# Patient Record
Sex: Female | Born: 1990 | Race: Black or African American | Hispanic: No | Marital: Married | Smoking: Never smoker
Health system: Southern US, Community
[De-identification: ages and names within clinical notes are randomized; demographics above are authoritative.]

## PROBLEM LIST (undated history)

## (undated) DIAGNOSIS — Z789 Other specified health status: Secondary | ICD-10-CM

## (undated) DIAGNOSIS — D649 Anemia, unspecified: Secondary | ICD-10-CM

## (undated) HISTORY — PX: WISDOM TOOTH EXTRACTION: SHX21

## (undated) HISTORY — DX: Other specified health status: Z78.9

---

## 2019-11-22 NOTE — L&D Delivery Note (Signed)
Delivery Summary for Wanda Richmond  Labor Events:   Preterm labor: No data found  Rupture date: 09/18/2020  Rupture time: 7:36 AM  Rupture type: Spontaneous  Fluid Color: Clear  Induction: No data found  Augmentation: No data found  Complications: No data found  Cervical ripening: No data found No data found   No data found     Delivery:   Episiotomy: No data found  Lacerations: No data found  Repair suture: No data found  Repair # of packets: No data found  Blood loss (ml): 100   Information for the patient's newborn:  See, Beharry [[id number]]    Delivery 09/18/2020 10:20 AM by  Vaginal, Spontaneous Sex:  female Gestational Age: [redacted]w[redacted]d Delivery Clinician:   Living?:         APGARS  One minute Five minutes Ten minutes  Skin color:        Heart rate:        Grimace:        Muscle tone:        Breathing:        Totals: 7  9      Presentation/position:      Resuscitation:   Cord information:    Disposition of cord blood:     Blood gases sent?  Complications:   Placenta: Delivered:       appearance Newborn Measurements: Weight: 7 lb 1.6 oz (3220 g)  Height: 20.67"  Head circumference:    Chest circumference:    Other providers:    Additional  information: Forceps:   Vacuum:   Breech:   Observed anomalies         Delivery Note At 10:20 AM a viable and healthy female was delivered via Vaginal, Spontaneous (Presentation:   Occiput Posterior).  APGAR: 7, 9; weight 7 lb 1.6 oz (3220 g).  Terminal thick meconium fluid present.  Placenta status: Spontaneous, Intact.  Cord: 3 vessels with the following complications: Nuchal cord x 1, body cord x 1, and true knot x1.  Cord pH: not obtained. Delayed cord clamping performed, however observed for less than 2 minutes due to fetal status.   Anesthesia: Epidural Episiotomy: None Lacerations: None Suture Repair: None Est. Blood Loss (mL): 100  Mom to postpartum.  Baby to Couplet care / Skin to Skin.  Hildred Laser,  MD 09/18/2020, 1:09 PM

## 2020-02-03 ENCOUNTER — Other Ambulatory Visit: Payer: Self-pay

## 2020-02-03 ENCOUNTER — Encounter (INDEPENDENT_AMBULATORY_CARE_PROVIDER_SITE_OTHER): Payer: Self-pay

## 2020-02-03 ENCOUNTER — Ambulatory Visit: Payer: Self-pay

## 2020-02-03 VITALS — BP 100/62 | HR 91 | Ht 66.5 in | Wt 163.8 lb

## 2020-02-03 DIAGNOSIS — Z0283 Encounter for blood-alcohol and blood-drug test: Secondary | ICD-10-CM

## 2020-02-03 DIAGNOSIS — Z113 Encounter for screening for infections with a predominantly sexual mode of transmission: Secondary | ICD-10-CM

## 2020-02-03 DIAGNOSIS — Z3481 Encounter for supervision of other normal pregnancy, first trimester: Secondary | ICD-10-CM

## 2020-02-03 NOTE — Patient Instructions (Signed)
Heartburn During Pregnancy  Heartburn is pain or discomfort in the throat or chest. It may cause a burning feeling. It happens when stomach acid moves up into the tube that carries food from your mouth to your stomach (esophagus). Heartburn is common during pregnancy. It usually goes away or gets better after giving birth. Follow these instructions at home: Eating and drinking  Do not drink alcohol while you are pregnant.  Figure out which foods and beverages make you feel worse, and avoid them.  Beverages that you may want to avoid include: ? Coffee and tea (with or without caffeine). ? Energy drinks and sports drinks. ? Bubbly (carbonated) drinks or sodas. ? Citrus fruit juices.  Foods that you may want to avoid include: ? Chocolate and cocoa. ? Peppermint and mint flavorings. ? Garlic, onions, and horseradish. ? Spicy and acidic foods. These include peppers, chili powder, curry powder, vinegar, hot sauces, and barbecue sauce. ? Citrus fruits, such as oranges, lemons, and limes. ? Tomato-based foods, such as red sauce, chili, and salsa. ? Fried and fatty foods, such as donuts, french fries, potato chips, and high-fat dressings. ? High-fat meats, such as hot dogs, cold cuts, sausage, ham, and bacon. ? High-fat dairy items, such as whole milk, butter, and cheese.  Eat small meals often, instead of large meals.  Avoid drinking a lot of liquid with your meals.  Avoid eating meals during the 2-3 hours before you go to bed.  Avoid lying down right after you eat.  Do not exercise right after you eat. Medicines  Take over-the-counter and prescription medicines only as told by your doctor.  Do not take aspirin, ibuprofen, or other NSAIDs unless your doctor tells you to do that.  Your doctor may tell you to avoid medicines that have sodium bicarbonate in them. General instructions   If told, raise the head of your bed about 6 inches (15 cm). You can do this by putting blocks  under the legs. Sleeping with more pillows does not help with heartburn.  Do not use any products that contain nicotine or tobacco, such as cigarettes and e-cigarettes. If you need help quitting, ask your doctor.  Wear loose-fitting clothing.  Try to lower your stress, such as with yoga or meditation. If you need help, ask your doctor.  Stay at a healthy weight. If you are overweight, work with your doctor to safely lose weight.  Keep all follow-up visits as told by your doctor. This is important. Contact a doctor if:  You get new symptoms.  Your symptoms do not get better with treatment.  You have weight loss and you do not know why.  You have trouble swallowing.  You make loud sounds when you breathe (wheeze).  You have a cough that does not go away.  You have heartburn often for more than 2 weeks.  You feel sick to your stomach (nauseous), and this does not get better with treatment.  You are throwing up (vomiting), and this does not get better with treatment.  You have pain in your belly (abdomen). Get help right away if:  You have very bad chest pain that spreads to your arm, neck, or jaw.  You feel sweaty, dizzy, or light-headed.  You have trouble breathing.  You have pain when swallowing.  You throw up and your throw-up looks like blood or coffee grounds.  Your poop (stool) is bloody or black. This information is not intended to replace advice given to you by your health  care provider. Make sure you discuss any questions you have with your health care provider. Document Revised: 02/28/2019 Document Reviewed: 07/25/2016 Elsevier Patient Education  [place] of Pregnancy  The second trimester is from week 14 through week 27 (month 4 through 6). This is often the time in pregnancy that you feel your best. Often times, morning sickness has lessened or quit. You may have more energy, and you may get hungry more often. Your unborn baby is  growing rapidly. At the end of the sixth month, he or she is about 9 inches long and weighs about 1 pounds. You will likely feel the baby move between 18 and 20 weeks of pregnancy. Follow these instructions at home: Medicines  Take over-the-counter and prescription medicines only as told by your doctor. Some medicines are safe and some medicines are not safe during pregnancy.  Take a prenatal vitamin that contains at least 600 micrograms (mcg) of folic acid.  If you have trouble pooping (constipation), take medicine that will make your stool soft (stool softener) if your doctor approves. Eating and drinking   Eat regular, healthy meals.  Avoid raw meat and uncooked cheese.  If you get low calcium from the food you eat, talk to your doctor about taking a daily calcium supplement.  Avoid foods that are high in fat and sugars, such as fried and sweet foods.  If you feel sick to your stomach (nauseous) or throw up (vomit): ? Eat 4 or 5 small meals a day instead of 3 large meals. ? Try eating a few soda crackers. ? Drink liquids between meals instead of during meals.  To prevent constipation: ? Eat foods that are high in fiber, like fresh fruits and vegetables, whole grains, and beans. ? Drink enough fluids to keep your pee (urine) clear or pale yellow. Activity  Exercise only as told by your doctor. Stop exercising if you start to have cramps.  Do not exercise if it is too hot, too humid, or if you are in a place of great height (high altitude).  Avoid heavy lifting.  Wear low-heeled shoes. Sit and stand up straight.  You can continue to have sex unless your doctor tells you not to. Relieving pain and discomfort  Wear a good support bra if your breasts are tender.  Take warm water baths (sitz baths) to soothe pain or discomfort caused by hemorrhoids. Use hemorrhoid cream if your doctor approves.  Rest with your legs raised if you have leg cramps or low back pain.  If you  develop puffy, bulging veins (varicose veins) in your legs: ? Wear support hose or compression stockings as told by your doctor. ? Raise (elevate) your feet for 15 minutes, 3-4 times a day. ? Limit salt in your food. Prenatal care  Write down your questions. Take them to your prenatal visits.  Keep all your prenatal visits as told by your doctor. This is important. Safety  Wear your seat belt when driving.  Make a list of emergency phone numbers, including numbers for family, friends, the hospital, and police and fire departments. General instructions  Ask your doctor about the right foods to eat or for help finding a counselor, if you need these services.  Ask your doctor about local prenatal classes. Begin classes before month 6 of your pregnancy.  Do not use hot tubs, steam rooms, or saunas.  Do not douche or use tampons or scented sanitary pads.  Do not cross your legs for  long periods of time.  Visit your dentist if you have not done so. Use a soft toothbrush to brush your teeth. Floss gently.  Avoid all smoking, herbs, and alcohol. Avoid drugs that are not approved by your doctor.  Do not use any products that contain nicotine or tobacco, such as cigarettes and e-cigarettes. If you need help quitting, ask your doctor.  Avoid cat litter boxes and soil used by cats. These carry germs that can cause birth defects in the baby and can cause a loss of your baby (miscarriage) or stillbirth. Contact a doctor if:  You have mild cramps or pressure in your lower belly.  You have pain when you pee (urinate).  You have bad smelling fluid coming from your vagina.  You continue to feel sick to your stomach (nauseous), throw up (vomit), or have watery poop (diarrhea).  You have a nagging pain in your belly area.  You feel dizzy. Get help right away if:  You have a fever.  You are leaking fluid from your vagina.  You have spotting or bleeding from your vagina.  You have  severe belly cramping or pain.  You lose or gain weight rapidly.  You have trouble catching your breath and have chest pain.  You notice sudden or extreme puffiness (swelling) of your face, hands, ankles, feet, or legs.  You have not felt the baby move in over an hour.  You have severe headaches that do not go away when you take medicine.  You have trouble seeing. Summary  The second trimester is from week 14 through week 27 (months 4 through 6). This is often the time in pregnancy that you feel your best.  To take care of yourself and your unborn baby, you will need to eat healthy meals, take medicines only if your doctor tells you to do so, and do activities that are safe for you and your baby.  Call your doctor if you get sick or if you notice anything unusual about your pregnancy. Also, call your doctor if you need help with the right food to eat, or if you want to know what activities are safe for you. This information is not intended to replace advice given to you by your health care provider. Make sure you discuss any questions you have with your health care provider. Document Revised: 03/01/2019 Document Reviewed: 12/13/2016 Elsevier Patient Education  [place]. Morning Sickness  Morning sickness is when you feel sick to your stomach (nauseous) during pregnancy. You may feel sick to your stomach and throw up (vomit). You may feel sick in the morning, but you can feel this way at any time of day. Some women feel very sick to their stomach and cannot stop throwing up (hyperemesis gravidarum). Follow these instructions at home: Medicines  Take over-the-counter and prescription medicines only as told by your doctor. Do not take any medicines until you talk with your doctor about them first.  Taking multivitamins before getting pregnant can stop or lessen the harshness of morning sickness. Eating and drinking  Eat dry toast or crackers before getting out of bed.  Eat  5 or 6 small meals a day.  Eat dry and bland foods like rice and baked potatoes.  Do not eat greasy, fatty, or spicy foods.  Have someone cook for you if the smell of food causes you to feel sick or throw up.  If you feel sick to your stomach after taking prenatal vitamins, take them at night  or with a snack.  Eat protein when you need a snack. Nuts, yogurt, and cheese are good choices.  Drink fluids throughout the day.  Try ginger ale made with real ginger, ginger tea made from fresh grated ginger, or ginger candies. General instructions  Do not use any products that have nicotine or tobacco in them, such as cigarettes and e-cigarettes. If you need help quitting, ask your doctor.  Use an air purifier to keep the air in your house free of smells.  Get lots of fresh air.  Try to avoid smells that make you feel sick.  Try: ? Wearing a bracelet that is used for seasickness (acupressure wristband). ? Going to a doctor who puts thin needles into certain body points (acupuncture) to improve how you feel. Contact a doctor if:  You need medicine to feel better.  You feel dizzy or light-headed.  You are losing weight. Get help right away if:  You feel very sick to your stomach and cannot stop throwing up.  You pass out (faint).  You have very bad pain in your belly. Summary  Morning sickness is when you feel sick to your stomach (nauseous) during pregnancy.  You may feel sick in the morning, but you can feel this way at any time of day.  Making some changes to what you eat may help your symptoms go away. This information is not intended to replace advice given to you by your health care provider. Make sure you discuss any questions you have with your health care provider. Document Revised: 10/20/2017 Document Reviewed: 12/08/2016 Elsevier Patient Education  2020 [employer]. How a Baby Grows During Pregnancy  Pregnancy begins when a female's sperm enters a female's egg  (fertilization). Fertilization usually happens in one of the tubes (fallopian tubes) that connect the ovaries to the womb (uterus). The fertilized egg moves down the fallopian tube to the uterus. Once it reaches the uterus, it implants into the lining of the uterus and begins to grow. For the first 10 weeks, the fertilized egg is called an embryo. After 10 weeks, it is called a fetus. As the fetus continues to grow, it receives oxygen and nutrients through tissue (placenta) that grows to support the developing baby. The placenta is the life support system for the baby. It provides oxygen and nutrition and removes waste. Learning as much as you can about your pregnancy and how your baby is developing can help you enjoy the experience. It can also make you aware of when there might be a problem and when to ask questions. How long does a typical pregnancy last? A pregnancy usually lasts 280 days, or about 40 weeks. Pregnancy is divided into three periods of growth, also called trimesters:  First trimester: 0-12 weeks.  Second trimester: 13-27 weeks.  Third trimester: 28-40 weeks. The day when your baby is ready to be born (full term) is your estimated date of delivery. How does my baby develop month by month? First month  The fertilized egg attaches to the inside of the uterus.  Some cells will form the placenta. Others will form the fetus.  The arms, legs, brain, spinal cord, lungs, and heart begin to develop.  At the end of the first month, the heart begins to beat. Second month  The bones, inner ear, eyelids, hands, and feet form.  The genitals develop.  By the end of 8 weeks, all major organs are developing. Third month  All of the internal organs are forming.  Teeth develop below the gums.  Bones and muscles begin to grow. The spine can flex.  The skin is transparent.  Fingernails and toenails begin to form.  Arms and legs continue to grow longer, and hands and feet  develop.  The fetus is about 3 inches (7.6 cm) long. Fourth month  The placenta is completely formed.  The external sex organs, neck, outer ear, eyebrows, eyelids, and fingernails are formed.  The fetus can hear, swallow, and move its arms and legs.  The kidneys begin to produce urine.  The skin is covered with a white, waxy coating (vernix) and very fine hair (lanugo). Fifth month  The fetus moves around more and can be felt for the first time (quickening).  The fetus starts to sleep and wake up and may begin to suck its finger.  The nails grow to the end of the fingers.  The organ in the digestive system that makes bile (gallbladder) functions and helps to digest nutrients.  If your baby is a girl, eggs are present in her ovaries. If your baby is a boy, testicles start to move down into his scrotum. Sixth month  The lungs are formed.  The eyes open. The brain continues to develop.  Your baby has fingerprints and toe prints. Your baby's hair grows thicker.  At the end of the second trimester, the fetus is about 9 inches (22.9 cm) long. Seventh month  The fetus kicks and stretches.  The eyes are developed enough to sense changes in light.  The hands can make a grasping motion.  The fetus responds to sound. Eighth month  All organs and body systems are fully developed and functioning.  Bones harden, and taste buds develop. The fetus may hiccup.  Certain areas of the brain are still developing. The skull remains soft. Ninth month  The fetus gains about  lb (0.23 kg) each week.  The lungs are fully developed.  Patterns of sleep develop.  The fetus's head typically moves into a head-down position (vertex) in the uterus to prepare for birth.  The fetus weighs 6-9 lb (2.72-4.08 kg) and is 19-20 inches (48.26-50.8 cm) long. What can I do to have a healthy pregnancy and help my baby develop? General instructions  Take prenatal vitamins as directed by your  health care provider. These include vitamins such as folic acid, iron, calcium, and vitamin D. They are important for healthy development.  Take medicines only as directed by your health care provider. Read labels and ask a pharmacist or your health care provider whether over-the-counter medicines, supplements, and prescription drugs are safe to take during pregnancy.  Keep all follow-up visits as directed by your health care provider. This is important. Follow-up visits include prenatal care and screening tests. How do I know if my baby is developing well? At each prenatal visit, your health care provider will do several different tests to check on your health and keep track of your baby's development. These include:  Fundal height and position. ? Your health care provider will measure your growing belly from your pubic bone to the top of the uterus using a tape measure. ? Your health care provider will also feel your belly to determine your baby's position.  Heartbeat. ? An ultrasound in the first trimester can confirm pregnancy and show a heartbeat, depending on how far along you are. ? Your health care provider will check your baby's heart rate at every prenatal visit.  Second trimester ultrasound. ? This ultrasound checks  your baby's development. It also may show your baby's gender. What should I do if I have concerns about my baby's development? Always talk with your health care provider about any concerns that you may have about your pregnancy and your baby. Summary  A pregnancy usually lasts 280 days, or about 40 weeks. Pregnancy is divided into three periods of growth, also called trimesters.  Your health care provider will monitor your baby's growth and development throughout your pregnancy.  Follow your health care provider's recommendations about taking prenatal vitamins and medicines during your pregnancy.  Talk with your health care provider if you have any concerns about  your pregnancy or your developing baby. This information is not intended to replace advice given to you by your health care provider. Make sure you discuss any questions you have with your health care provider. Document Revised: 02/28/2019 Document Reviewed: 09/20/2017 Elsevier Patient Education  2020 [place] of Pregnancy  The first trimester of pregnancy is from week 1 until the end of week 13 (months 1 through 3). During this time, your baby will begin to develop inside you. At 6-8 weeks, the eyes and face are formed, and the heartbeat can be seen on ultrasound. At the end of 12 weeks, all the baby's organs are formed. Prenatal care is all the medical care you receive before the birth of your baby. Make sure you get good prenatal care and follow all of your doctor's instructions. Follow these instructions at home: Medicines  Take over-the-counter and prescription medicines only as told by your doctor. Some medicines are safe and some medicines are not safe during pregnancy.  Take a prenatal vitamin that contains at least 600 micrograms (mcg) of folic acid.  If you have trouble pooping (constipation), take medicine that will make your stool soft (stool softener) if your doctor approves. Eating and drinking   Eat regular, healthy meals.  Your doctor will tell you the amount of weight gain that is right for you.  Avoid raw meat and uncooked cheese.  If you feel sick to your stomach (nauseous) or throw up (vomit): ? Eat 4 or 5 small meals a day instead of 3 large meals. ? Try eating a few soda crackers. ? Drink liquids between meals instead of during meals.  To prevent constipation: ? Eat foods that are high in fiber, like fresh fruits and vegetables, whole grains, and beans. ? Drink enough fluids to keep your pee (urine) clear or pale yellow. Activity  Exercise only as told by your doctor. Stop exercising if you have cramps or pain in your lower belly (abdomen)  or low back.  Do not exercise if it is too hot, too humid, or if you are in a place of great height (high altitude).  Try to avoid standing for long periods of time. Move your legs often if you must stand in one place for a long time.  Avoid heavy lifting.  Wear low-heeled shoes. Sit and stand up straight.  You can have sex unless your doctor tells you not to. Relieving pain and discomfort  Wear a good support bra if your breasts are sore.  Take warm water baths (sitz baths) to soothe pain or discomfort caused by hemorrhoids. Use hemorrhoid cream if your doctor says it is okay.  Rest with your legs raised if you have leg cramps or low back pain.  If you have puffy, bulging veins (varicose veins) in your legs: ? Wear support hose or compression stockings as  told by your doctor. ? Raise (elevate) your feet for 15 minutes, 3-4 times a day. ? Limit salt in your food. Prenatal care  Schedule your prenatal visits by the twelfth week of pregnancy.  Write down your questions. Take them to your prenatal visits.  Keep all your prenatal visits as told by your doctor. This is important. Safety  Wear your seat belt at all times when driving.  Make a list of emergency phone numbers. The list should include numbers for family, friends, the hospital, and police and fire departments. General instructions  Ask your doctor for a referral to a local prenatal class. Begin classes no later than at the start of month 6 of your pregnancy.  Ask for help if you need counseling or if you need help with nutrition. Your doctor can give you advice or tell you where to go for help.  Do not use hot tubs, steam rooms, or saunas.  Do not douche or use tampons or scented sanitary pads.  Do not cross your legs for long periods of time.  Avoid all herbs and alcohol. Avoid drugs that are not approved by your doctor.  Do not use any tobacco products, including cigarettes, chewing tobacco, and electronic  cigarettes. If you need help quitting, ask your doctor. You may get counseling or other support to help you quit.  Avoid cat litter boxes and soil used by cats. These carry germs that can cause birth defects in the baby and can cause a loss of your baby (miscarriage) or stillbirth.  Visit your dentist. At home, brush your teeth with a soft toothbrush. Be gentle when you floss. Contact a doctor if:  You are dizzy.  You have mild cramps or pressure in your lower belly.  You have a nagging pain in your belly area.  You continue to feel sick to your stomach, you throw up, or you have watery poop (diarrhea).  You have a bad smelling fluid coming from your vagina.  You have pain when you pee (urinate).  You have increased puffiness (swelling) in your face, hands, legs, or ankles. Get help right away if:  You have a fever.  You are leaking fluid from your vagina.  You have spotting or bleeding from your vagina.  You have very bad belly cramping or pain.  You gain or lose weight rapidly.  You throw up blood. It may look like coffee grounds.  You are around people who have [place] measles, fifth disease, or chickenpox.  You have a very bad headache.  You have shortness of breath.  You have any kind of trauma, such as from a fall or a car accident. Summary  The first trimester of pregnancy is from week 1 until the end of week 13 (months 1 through 3).  To take care of yourself and your unborn baby, you will need to eat healthy meals, take medicines only if your doctor tells you to do so, and do activities that are safe for you and your baby.  Keep all follow-up visits as told by your doctor. This is important as your doctor will have to ensure that your baby is healthy and growing well. This information is not intended to replace advice given to you by your health care provider. Make sure you discuss any questions you have with your health care provider. Document Revised:  02/28/2019 Document Reviewed: 11/15/2016 Elsevier Patient Education  2020 [employer]. Commonly Asked Questions During Pregnancy  Cats: A parasite can be excreted  in cat feces.  To avoid exposure you need to have another person empty the little box.  If you must empty the litter box you will need to wear gloves.  Wash your hands after handling your cat.  This parasite can also be found in raw or undercooked meat so this should also be avoided.  Colds, Sore Throats, Flu: Please check your medication sheet to see what you can take for symptoms.  If your symptoms are unrelieved by these medications please call the office.  Dental Work: Most any dental work [employer] recommends is permitted.  X-rays should only be taken during the first trimester if absolutely necessary.  Your abdomen should be shielded with a lead apron during all x-rays.  Please notify your provider prior to receiving any x-rays.  Novocaine is fine; gas is not recommended.  If your dentist requires a note from [place] prior to dental work please call the office and we will provide one for you.  Exercise: Exercise is an important part of staying healthy during your pregnancy.  You may continue most exercises you were accustomed to prior to pregnancy.  Later in your pregnancy you will most likely notice you have difficulty with activities requiring balance like riding a bicycle.  It is important that you listen to your body and avoid activities that put you at a higher risk of falling.  Adequate rest and staying well hydrated are a must!  If you have questions about the safety of specific activities ask your provider.    Exposure to Children with illness: Try to avoid obvious exposure; report any symptoms to [place] when noted,  If you have chicken pos, red measles or mumps, you should be immune to these diseases.   Please do not take any vaccines while pregnant unless you have checked with your OB provider.  Fetal Movement: After 28 weeks we  recommend you do "kick counts" twice daily.  Lie or sit down in a calm quiet environment and count your baby movements "kicks".  You should feel your baby at least 10 times per hour.  If you have not felt 10 kicks within the first hour get up, walk around and have something sweet to eat or drink then repeat for an additional hour.  If count remains less than 10 per hour notify your provider.  Fumigating: Follow your pest control agent's advice as to how long to stay out of your home.  Ventilate the area well before re-entering.  Hemorrhoids:   Most over-the-counter preparations can be used during pregnancy.  Check your medication to see what is safe to use.  It is important to use a stool softener or fiber in your diet and to drink lots of liquids.  If hemorrhoids seem to be getting worse please call the office.   Hot Tubs:  Hot tubs Jacuzzis and saunas are not recommended while pregnant.  These increase your internal body temperature and should be avoided.  Intercourse:  Sexual intercourse is safe during pregnancy as long as you are comfortable, unless otherwise advised by your provider.  Spotting may occur after intercourse; report any bright red bleeding that is heavier than spotting.  Labor:  If you know that you are in labor, please go to the hospital.  If you are unsure, please call the office and let us help you decide what to do.  Lifting, straining, etc:  If your job requires heavy lifting or straining please check with your provider for any limitations.  Generally, you should not lift items heavier than that you can lift simply with your hands and arms (no back muscles)  Painting:  Paint fumes do not harm your pregnancy, but may make you ill and should be avoided if possible.  Latex or water based paints have less odor than oils.  Use adequate ventilation while painting.  Permanents & Hair Color:  Chemicals in hair dyes are not recommended as they cause increase hair dryness which can  increase hair loss during pregnancy.  " Highlighting" and permanents are allowed.  Dye may be absorbed differently and permanents may not hold as well during pregnancy.  Sunbathing:  Use a sunscreen, as skin burns easily during pregnancy.  Drink plenty of fluids; avoid over heating.  Tanning Beds:  Because their possible side effects are still unknown, tanning beds are not recommended.  Ultrasound Scans:  Routine ultrasounds are performed at approximately 20 weeks.  You will be able to see your baby's general anatomy an if you would like to know the gender this can usually be determined as well.  If it is questionable when you conceived you may also receive an ultrasound early in your pregnancy for dating purposes.  Otherwise ultrasound exams are not routinely performed unless there is a medical necessity.  Although you can request a scan we ask that you pay for it when conducted because insurance does not cover " patient request" scans.  Work: If your pregnancy proceeds without complications you may work until your due date, unless your physician or employer advises otherwise.  Round Ligament Pain/Pelvic Discomfort:  Sharp, shooting pains not associated with bleeding are fairly common, usually occurring in the second trimester of pregnancy.  They tend to be worse when standing up or when you remain standing for long periods of time.  These are the result of pressure of certain pelvic ligaments called "round ligaments".  Rest, Tylenol and heat seem to be the most effective relief.  As the womb and fetus grow, they rise out of the pelvis and the discomfort improves.  Please notify the office if your pain seems different than that described.  It may represent a more serious condition.  Common Medications Safe in Pregnancy  Acne:      Constipation:  Benzoyl Peroxide     Colace  Clindamycin      Dulcolax Suppository  Topica Erythromycin     Fibercon  Salicylic  Acid      Metamucil         Miralax AVOID:        Senakot   Accutane    Cough:  Retin-A       Cough Drops  Tetracycline      Phenergan w/ Codeine if Rx  Minocycline      Robitussin (Plain & DM)  Antibiotics:     Crabs/Lice:  Ceclor       RID  Cephalosporins    AVOID:  E-Mycins      Kwell  Keflex  Macrobid/Macrodantin   Diarrhea:  Penicillin      Kao-Pectate  Zithromax      Imodium AD         PUSH FLUIDS AVOID:       Cipro     Fever:  Tetracycline      Tylenol (Regular or Extra  Minocycline       Strength)  Levaquin      Extra Strength-Do not          Exceed 8 tabs/24 hrs  Caffeine:        <249m/day (equiv. To 1 cup of coffee or  approx. 3 12 oz sodas)         Gas: Cold/Hayfever:       Gas-X  Benadryl      Mylicon  Claritin       Phazyme  **Claritin-D        Chlor-Trimeton    Headaches:  Dimetapp      ASA-Free Excedrin  Drixoral-Non-Drowsy     Cold Compress  Mucinex (Guaifenasin)     Tylenol (Regular or Extra  Sudafed/Sudafed-12 Hour     Strength)  **Sudafed PE Pseudoephedrine   Tylenol Cold & Sinus     Vicks Vapor Rub  Zyrtec  **AVOID if Problems With Blood Pressure         Heartburn: Avoid lying down for at least 1 hour after meals  Aciphex      Maalox     Rash:  Milk of Magnesia     Benadryl    Mylanta       1% Hydrocortisone Cream  Pepcid  Pepcid Complete   Sleep Aids:  Prevacid      Ambien   Prilosec       Benadryl  Rolaids       Chamomile Tea  Tums (Limit 4/day)     Unisom  Zantac       Tylenol PM         Warm milk-add vanilla or  Hemorrhoids:       Sugar for taste  Anusol/Anusol H.C.  (RX: Analapram 2.5%)  Sugar Substitutes:  Hydrocortisone OTC     Ok in moderation  Preparation H      Tucks        Vaseline lotion applied to tissue with wiping    Herpes:     Throat:  Acyclovir      Oragel  Famvir  Valtrex     Vaccines:         Flu Shot Leg Cramps:       *Gardasil  Benadryl      Hepatitis A         Hepatitis B Nasal  Spray:       Pneumovax  Saline Nasal Spray     Polio Booster         Tetanus Nausea:       Tuberculosis test or PPD  Vitamin B6 25 mg TID   AVOID:    Dramamine      *Gardasil  Emetrol       Live Poliovirus  Ginger Root 250 mg QID    MMR (measles, mumps &  High Complex Carbs @ Bedtime    rebella)  Sea Bands-Accupressure    Varicella (Chickenpox)  Unisom 1/2 tab TID     *No known complications           If received before Pain:         Known pregnancy;   Darvocet       Resume series after  Lortab        Delivery  Percocet    Yeast:   Tramadol      Femstat  Tylenol 3      Gyne-lotrimin  Ultram       Monistat  Vicodin           MISC:         All Sunscreens           Hair Coloring/highlights  Insect Repellant's          (Including DEET)         Mystic Tans

## 2020-02-03 NOTE — Progress Notes (Signed)
      Wanda Richmond presents for NOB nurse intake visit. Pregnancy confirmation done at PCP in [place], 01/23/2020, with unsure.  G 3.  P1011.  LMP 12/16/2019.  EDD 09/21/2020.  [place] [redacted]w[redacted]d. Pregnancy education material explained and given.  0 cats in the home.  NOB labs ordered. BMI greater less than 30. TSH/HbgA1c not ordered. Sickle cell order due to race. HIV and drug screen explained and ordered. Genetic screening discussed. Genetic testing; Unsure. Pt to discuss genetic testing with provider. PNV encouraged. Pt to follow up with provider in 5 weeks for NOB physical and this week for u/s dating and viability.  [facility] Financial Policy and FMLA form.   Pt c/o really bad heartburn that was not relieved by Tums. Pt was given safe medication list and advised to try other forms of medications for heartburn.

## 2020-02-04 ENCOUNTER — Telehealth: Payer: Self-pay | Admitting: Obstetrics and Gynecology

## 2020-02-04 LAB — URINALYSIS, ROUTINE W REFLEX MICROSCOPIC
Bilirubin, UA: NEGATIVE
Glucose, UA: NEGATIVE
Nitrite, UA: NEGATIVE
Protein,UA: NEGATIVE
RBC, UA: NEGATIVE
Specific Gravity, UA: 1.026 (ref 1.005–1.030)
Urobilinogen, Ur: 0.2 mg/dL (ref 0.2–1.0)
pH, UA: 6.5 (ref 5.0–7.5)

## 2020-02-04 LAB — DRUG PROFILE, UR, 9 DRUGS (LABCORP)
Amphetamines, Urine: NEGATIVE ng/mL
Barbiturate Quant, Ur: NEGATIVE ng/mL
Benzodiazepine Quant, Ur: NEGATIVE ng/mL
Cannabinoid Quant, Ur: NEGATIVE ng/mL
Cocaine (Metab.): NEGATIVE ng/mL
Methadone Screen, Urine: NEGATIVE ng/mL
Opiate Quant, Ur: NEGATIVE ng/mL
PCP Quant, Ur: NEGATIVE ng/mL
Propoxyphene: NEGATIVE ng/mL

## 2020-02-04 LAB — TOXOPLASMA ANTIBODIES- IGG AND  IGM
Toxoplasma Antibody- IgM: <3 AU/mL (ref 0.0–7.9)
Toxoplasma IgG Ratio: <3 IU/mL (ref 0.0–7.1)

## 2020-02-04 LAB — NICOTINE SCREEN, URINE: Cotinine Ql Scrn, Ur: NEGATIVE ng/mL

## 2020-02-04 LAB — MICROSCOPIC EXAMINATION
Casts: NONE SEEN /lpf
RBC, Urine: NONE SEEN /hpf (ref 0–2)
WBC, UA: NONE SEEN /hpf (ref 0–5)

## 2020-02-04 LAB — HEPATITIS B SURFACE ANTIGEN: Hepatitis B Surface Ag: NEGATIVE

## 2020-02-04 LAB — HIV ANTIBODY (ROUTINE TESTING W REFLEX): HIV Screen 4th Generation wRfx: NONREACTIVE

## 2020-02-04 LAB — HGB SOLU + RFLX FRAC: Sickle Solubility Test - HGBRFX: NEGATIVE

## 2020-02-04 LAB — RPR: RPR Ser Ql: NONREACTIVE

## 2020-02-04 LAB — RUBELLA SCREEN: Rubella Antibodies, IGG: <0.9 index — ABNORMAL LOW (ref >0.99)

## 2020-02-04 LAB — VARICELLA ZOSTER ANTIBODY, IGG: Varicella zoster IgG: 703 index (ref >165)

## 2020-02-04 LAB — ANTIBODY SCREEN: Antibody Screen: NEGATIVE

## 2020-02-04 LAB — ABO AND RH: Rh Factor: POSITIVE

## 2020-02-04 NOTE — Telephone Encounter (Signed)
Tried to call patient back. Mail box is not set up to leave a message.

## 2020-02-04 NOTE — Telephone Encounter (Signed)
Spoke with patient and explained that all labs are normal now. If anything is abnormal we will let her know. Dr. Stann Mainland also review labs and go over them during the new OB appointment. Patient verbalized understanding.

## 2020-02-04 NOTE — Telephone Encounter (Signed)
Pt called in and stated that she got her results on mychart but she is requesting a call back. Please advise

## 2020-02-05 ENCOUNTER — Encounter: Payer: Self-pay | Admitting: Obstetrics and Gynecology

## 2020-02-05 LAB — URINE CULTURE, OB REFLEX

## 2020-02-05 LAB — CULTURE, OB URINE

## 2020-02-05 LAB — GC/CHLAMYDIA PROBE AMP
Chlamydia trachomatis, NAA: NEGATIVE
Neisseria Gonorrhoeae by PCR: NEGATIVE

## 2020-02-11 ENCOUNTER — Other Ambulatory Visit: Payer: Self-pay

## 2020-02-11 ENCOUNTER — Ambulatory Visit (INDEPENDENT_AMBULATORY_CARE_PROVIDER_SITE_OTHER): Payer: Self-pay

## 2020-02-11 DIAGNOSIS — Z3481 Encounter for supervision of other normal pregnancy, first trimester: Secondary | ICD-10-CM

## 2020-03-10 ENCOUNTER — Other Ambulatory Visit: Payer: Self-pay

## 2020-03-10 ENCOUNTER — Encounter: Payer: Self-pay | Admitting: Obstetrics and Gynecology

## 2020-03-10 ENCOUNTER — Ambulatory Visit (INDEPENDENT_AMBULATORY_CARE_PROVIDER_SITE_OTHER): Payer: Self-pay | Admitting: Obstetrics and Gynecology

## 2020-03-10 VITALS — BP 109/71 | HR 87 | Wt 160.1 lb

## 2020-03-10 DIAGNOSIS — Z3A12 12 weeks gestation of pregnancy: Secondary | ICD-10-CM

## 2020-03-10 DIAGNOSIS — Z3491 Encounter for supervision of normal pregnancy, unspecified, first trimester: Secondary | ICD-10-CM

## 2020-03-10 LAB — POCT URINALYSIS DIPSTICK OB
Bilirubin, UA: NEGATIVE
Blood, UA: NEGATIVE
Glucose, UA: NEGATIVE
Leukocytes, UA: NEGATIVE
Nitrite, UA: NEGATIVE
Spec Grav, UA: 1.01 (ref 1.010–1.025)
Urobilinogen, UA: 0.2 E.U./dL
pH, UA: 6.5 (ref 5.0–8.0)

## 2020-03-10 NOTE — Progress Notes (Addendum)
NOB: Nausea and vomiting have decreased.  Still has breast tenderness.  Patient complaining of increased frequency of urination and is requesting cultures for possible UTI.  Discussed urinary ketones and increasing fluid and caloric intake.  Taking prenatal vitamins as directed.  Desires maternity 21 testing.  AFP testing next visit. Previous pregnancy uncomplicated term spontaneous labor with vaginal delivery.  8 pounds 3 ounces.  Last pregnancy 8 years ago.  Physical examination General NAD, Conversant  HEENT Atraumatic; Op clear with mmm.  Normo-cephalic. Pupils reactive. Anicteric sclerae  Thyroid/Neck Smooth without nodularity or enlargement. Normal ROM.  Neck Supple.  Skin No rashes, lesions or ulceration. Normal palpated skin turgor. No nodularity.  Breasts: No masses or discharge.  Symmetric.  No axillary adenopathy.  Lungs: Clear to auscultation.No rales or wheezes. Normal Respiratory effort, no retractions.  Heart: NSR.  No murmurs or rubs appreciated. No periferal edema  Abdomen: Soft.  Non-tender.  No masses.  No HSM. No hernia  Extremities: Moves all appropriately.  Normal ROM for age. No lymphadenopathy.  Neuro: Oriented to PPT.  Normal mood. Normal affect.     Pelvic:   Vulva: Normal appearance.  No lesions.  Vagina: No lesions or abnormalities noted.  Support: Normal pelvic support.  Urethra No masses tenderness or scarring.  Meatus Normal size without lesions or prolapse.  Cervix: Normal appearance.  No lesions.  Anus: Normal exam.  No lesions.  Perineum: Normal exam.  No lesions.        Bimanual   Adnexae: No masses.  Non-tender to palpation.  Uterus: Enlarged. 162 BPM  -   Non-tender.  Mobile.  AV.  Adnexae: No masses.  Non-tender to palpation.  Cul-de-sac: Negative for abnormality.  Adnexae: No masses.  Non-tender to palpation.         Pelvimetry   Diagonal: Reached.  Spines: Average.  Sacrum: Concave.  Pubic Arch: Normal.   I spent 31 minutes involved in the  care of this patient preparing to see the patient by obtaining and reviewing her medical history (including labs, imaging tests and prior procedures), documenting clinical information in the electronic health record (EHR), counseling and coordinating care plans, writing and sending prescriptions, ordering tests or procedures and directly communicating with the patient by discussing pertinent items from her history and physical exam as well as detailing my assessment and plan as noted above so that she has an informed understanding.  All of her questions were answered.

## 2020-03-12 ENCOUNTER — Encounter (HOSPITAL_COMMUNITY): Payer: Self-pay

## 2020-03-12 ENCOUNTER — Other Ambulatory Visit: Payer: Self-pay

## 2020-03-12 ENCOUNTER — Ambulatory Visit (HOSPITAL_COMMUNITY)
Admission: EM | Admit: 2020-03-12 | Discharge: 2020-03-12 | Disposition: A | Discharge status: Home / Self Care | Payer: Self-pay | Attending: Family Medicine | Admitting: Family Medicine

## 2020-03-12 DIAGNOSIS — Z3201 Encounter for pregnancy test, result positive: Secondary | ICD-10-CM

## 2020-03-12 DIAGNOSIS — N390 Urinary tract infection, site not specified: Secondary | ICD-10-CM | POA: Insufficient documentation

## 2020-03-12 LAB — POCT URINALYSIS DIP (DEVICE)
Bilirubin Urine: NEGATIVE
Glucose, UA: NEGATIVE mg/dL
Hgb urine dipstick: NEGATIVE
Ketones, ur: >=160 mg/dL — AB
Nitrite: POSITIVE — AB
Protein, ur: NEGATIVE mg/dL
Specific Gravity, Urine: 1.02 (ref 1.005–1.030)
Urobilinogen, UA: 0.2 mg/dL (ref 0.0–1.0)
pH: 7 (ref 5.0–8.0)

## 2020-03-12 LAB — POC URINE PREG, ED: Preg Test, Ur: POSITIVE — AB

## 2020-03-12 LAB — POCT PREGNANCY, URINE: Preg Test, Ur: POSITIVE — AB

## 2020-03-12 MED ORDER — DOXYLAMINE-PYRIDOXINE 10-10 MG PO TBEC: 2.0000 | DELAYED_RELEASE_TABLET | Freq: Every day | ORAL | 0 refills | Status: DC

## 2020-03-12 MED ORDER — CEPHALEXIN 500 MG PO CAPS: 500.0000 mg | ORAL_CAPSULE | Freq: Two times a day (BID) | ORAL | 0 refills | Status: AC

## 2020-03-12 NOTE — ED Triage Notes (Signed)
Pt c/o frequent urination and tingling sensation when urinatingx4 days. Pt c/o 8/10 throbbing left flank/back painx4 days.

## 2020-03-12 NOTE — Discharge Instructions (Signed)
Urine showed evidence of infection. We are treating you with keflex-twice daily for 1 week. Be sure to take full course. Stay hydrated- urine should be pale yellow to clear.   Diclegis: Two tablets at bedtime on day 1 and 2; if symptoms persist, take 1 tablet in morning and 2 tablets at bedtime on day 3; if symptoms persist, may increase to 1 tablet in morning, 1 tablet mid-afternoon, and 2 tablets at bedtime on day 4 (maximum: doxylamine 40 mg/pyridoxine 40 mg (4 tablets) per day). OR One-half of the 25 mg Unisom sleep tablet over-the-counter tablet or two chewable 5 mg tablets can be used off-label as an antiemetic. In addition, pyridoxine 25 mg, also available over-the-counter, is taken three or four times per day;This is a reasonable, less expensive substitute for combination tablets.  Please return or follow up with your primary provider if symptoms not improving with treatment. Please return sooner if you have worsening of symptoms or develop fever, nausea, vomiting, abdominal pain, back pain, lightheadedness, dizziness.

## 2020-03-12 NOTE — ED Provider Notes (Signed)
[facility]    CSN: [id number] Arrival date & time: 03/12/20  1428      History   Chief Complaint Chief Complaint  Patient presents with  . Urinary Tract Infection    HPI Wanda Richmond is a 29 y.o. female 12 weeks 6 days pregnant presenting today for evaluation of possible UTI.  Patient notes over the past 4 days she has developed dysuria and urinary frequency.  She has also had associated left-sided flank pain which has been intense.  Will feel spasming at times.  Has had some nausea, but this has been persistent since initial pregnancy.  Had follow-up with OB/GYN 2 days ago and had urine sample which was negative for leuks and nitrites.  She was screened for STDs on 3/15.  Denies any changes in pelvic symptoms.  She has reported some white creamy discharge, but this has been persistent over the past month since initial screening.  Denies any new partners.  HPI  Past Medical History:  Diagnosis Date  . No pertinent past medical history     There are no problems to display for this patient.   Past Surgical History:  Procedure Laterality Date  . WISDOM TOOTH EXTRACTION      OB History    Gravida  3   Para  1   Term  1   Preterm      AB  1   Living  1     SAB  1   TAB      Ectopic      Multiple      Live Births  1            Home Medications    Prior to Admission medications   Medication Sig Start Date End Date Taking? Authorizing Provider  cephALEXin (KEFLEX) 500 MG capsule Take 1 capsule (500 mg total) by mouth 2 (two) times daily for 7 days. 03/12/20 03/19/20  Marchetta Navratil C, PA-C  Doxylamine-Pyridoxine 10-10 MG TBEC Take 2 tablets by mouth at bedtime. 03/12/20   Camillo Quadros C, PA-C  Prenatal Vit-Fe Fumarate-FA (MULTIVITAMIN-PRENATAL) 27-0.8 MG TABS tablet Take 1 tablet by mouth daily at 12 noon.    [provider]    Family History Family History  Problem Relation Age of Onset  . Healthy Mother   . Healthy Father     . Breast cancer Paternal Grandmother     Social History Social History   Tobacco Use  . Smoking status: Never Smoker  . Smokeless tobacco: Never Used  Substance Use Topics  . Alcohol use: Never  . Drug use: Never     Allergies   Patient has no known allergies.   Review of Systems Review of Systems  Constitutional: Negative for fever.  Respiratory: Negative for shortness of breath.   Cardiovascular: Negative for chest pain.  Gastrointestinal: Negative for abdominal pain, diarrhea, nausea and vomiting.  Genitourinary: Positive for dysuria, flank pain and vaginal discharge. Negative for genital sores, hematuria, menstrual problem, vaginal bleeding and vaginal pain.  Musculoskeletal: Positive for back pain.  Skin: Negative for rash.  Neurological: Negative for dizziness, light-headedness and headaches.     Physical Exam Triage Vital Signs ED Triage Vitals  Enc Vitals Group     BP 03/12/20 1457 114/62     Pulse Rate 03/12/20 1457 (!) 104     Resp 03/12/20 1457 16     Temp 03/12/20 1457 98.3 F (36.8 C)     Temp Source 03/12/20 1457  Oral     SpO2 03/12/20 1457 100 %     Weight 03/12/20 1458 160 lb (72.6 kg)     Height 03/12/20 1458 5\' 6"  (1.676 m)     Head Circumference --      Peak Flow --      Pain Score 03/12/20 1458 8     Pain Loc --      Pain Edu? --      Excl. in GC? --    No data found.  Updated Vital Signs BP 114/62   Pulse (!) 104   Temp 98.3 F (36.8 C) (Oral)   Resp 16   Ht 5\' 6"  (1.676 m)   Wt 160 lb (72.6 kg)   LMP 12/16/2019   SpO2 100%   BMI 25.82 kg/m   Visual Acuity Right Eye Distance:   Left Eye Distance:   Bilateral Distance:    Right Eye Near:   Left Eye Near:    Bilateral Near:     Physical Exam Vitals and nursing note reviewed.  Constitutional:      Appearance: She is well-developed.     Comments: No acute distress  HENT:     Head: Normocephalic and atraumatic.     Nose: Nose normal.  Eyes:     Conjunctiva/sclera:  Conjunctivae normal.  Cardiovascular:     Rate and Rhythm: Normal rate.  Pulmonary:     Effort: Pulmonary effort is normal. No respiratory distress.  Abdominal:     General: There is no distension.     Comments: Mild tenderness to palpation in left lower quadrant, negative rebound, negative Rovsing  Weakly positive CVA tenderness on left  Musculoskeletal:        General: Normal range of motion.     Cervical back: Neck supple.     Comments: Tenderness to palpation extending from left lower quadrant to left flank and left lower thoracic and superior lumbar area on left  Skin:    General: Skin is warm and dry.  Neurological:     Mental Status: She is alert and oriented to person, place, and time.      UC Treatments / Results  Labs (all labs ordered are listed, but only abnormal results are displayed) Labs Reviewed  POCT URINALYSIS DIP (DEVICE) - Abnormal; Notable for the following components:      Result Value   Ketones, ur >=160 (*)    Nitrite POSITIVE (*)    Leukocytes,Ua SMALL (*)    All other components within normal limits  POCT PREGNANCY, URINE - Abnormal; Notable for the following components:   Preg Test, Ur POSITIVE (*)    All other components within normal limits  POC URINE PREG, ED - Abnormal; Notable for the following components:   Preg Test, Ur POSITIVE (*)    All other components within normal limits  URINE CULTURE  POC URINE PREG, ED    EKG   Radiology No results found.  Procedures Procedures (including critical care time)  Medications Ordered in UC Medications - No data to display  Initial Impression / Assessment and Plan / UC Course  I have reviewed the triage vital signs and the nursing notes.  Pertinent labs & imaging results that were available during my care of the patient were reviewed by me and considered in my medical decision making (see chart for details).     UA with positive nitrites, small leuks, UTI, possible pyelonephritis in  setting of pregnancy.  Treating with Keflex twice daily  x1 week.  Urine culture pending.  Rest and push fluids.  Provided Diclegis to help with nausea symptoms.  Follow-up with OB as planned.  Discussed strict return precautions. Patient verbalized understanding and is agreeable with plan.  Final Clinical Impressions(s) / UC Diagnoses   Final diagnoses:  Lower urinary tract infectious disease     Discharge Instructions     Urine showed evidence of infection. We are treating you with keflex-twice daily for 1 week. Be sure to take full course. Stay hydrated- urine should be pale yellow to clear.   Diclegis: Two tablets at bedtime on day 1 and 2; if symptoms persist, take 1 tablet in morning and 2 tablets at bedtime on day 3; if symptoms persist, may increase to 1 tablet in morning, 1 tablet mid-afternoon, and 2 tablets at bedtime on day 4 (maximum: doxylamine 40 mg/pyridoxine 40 mg (4 tablets) per day). OR One-half of the 25 mg Unisom sleep tablet over-the-counter tablet or two chewable 5 mg tablets can be used off-label as an antiemetic. In addition, pyridoxine 25 mg, also available over-the-counter, is taken three or four times per day;This is a reasonable, less expensive substitute for combination tablets.  Please return or follow up with your primary provider if symptoms not improving with treatment. Please return sooner if you have worsening of symptoms or develop fever, nausea, vomiting, abdominal pain, back pain, lightheadedness, dizziness.     ED Prescriptions    Medication Sig Dispense Auth. Provider   cephALEXin (KEFLEX) 500 MG capsule Take 1 capsule (500 mg total) by mouth 2 (two) times daily for 7 days. 14 capsule Santresa Levett C, PA-C   Doxylamine-Pyridoxine 10-10 MG TBEC Take 2 tablets by mouth at bedtime. 60 tablet Chasten Blaze, Joppa C, PA-C     PDMP not reviewed this encounter.   Lew Dawes, [place] 03/12/20 1528

## 2020-03-13 LAB — URINE CULTURE

## 2020-03-15 LAB — MATERNIT21  PLUS CORE+ESS+SCA, BLOOD

## 2020-03-15 LAB — URINE CULTURE: Culture: >=100000 — AB

## 2020-04-07 ENCOUNTER — Encounter: Payer: Self-pay | Admitting: Obstetrics and Gynecology

## 2020-04-07 ENCOUNTER — Other Ambulatory Visit (HOSPITAL_COMMUNITY)
Admission: RE | Admit: 2020-04-07 | Discharge: 2020-04-07 | Disposition: A | Discharge status: Home / Self Care | Payer: Self-pay | Source: Ambulatory Visit | Attending: Obstetrics and Gynecology | Admitting: Obstetrics and Gynecology

## 2020-04-07 ENCOUNTER — Other Ambulatory Visit: Payer: Self-pay

## 2020-04-07 ENCOUNTER — Ambulatory Visit (INDEPENDENT_AMBULATORY_CARE_PROVIDER_SITE_OTHER): Payer: Medicaid Other | Admitting: Obstetrics and Gynecology

## 2020-04-07 VITALS — BP 94/63 | HR 75 | Wt 162.1 lb

## 2020-04-07 DIAGNOSIS — Z1379 Encounter for other screening for genetic and chromosomal anomalies: Secondary | ICD-10-CM | POA: Diagnosis not present

## 2020-04-07 DIAGNOSIS — N760 Acute vaginitis: Secondary | ICD-10-CM | POA: Diagnosis not present

## 2020-04-07 DIAGNOSIS — O2340 Unspecified infection of urinary tract in pregnancy, unspecified trimester: Secondary | ICD-10-CM | POA: Diagnosis not present

## 2020-04-07 DIAGNOSIS — O2341 Unspecified infection of urinary tract in pregnancy, first trimester: Secondary | ICD-10-CM | POA: Insufficient documentation

## 2020-04-07 DIAGNOSIS — Z124 Encounter for screening for malignant neoplasm of cervix: Secondary | ICD-10-CM | POA: Insufficient documentation

## 2020-04-07 DIAGNOSIS — Z3A16 16 weeks gestation of pregnancy: Secondary | ICD-10-CM

## 2020-04-07 DIAGNOSIS — Z3482 Encounter for supervision of other normal pregnancy, second trimester: Secondary | ICD-10-CM | POA: Insufficient documentation

## 2020-04-07 DIAGNOSIS — B9689 Other specified bacterial agents as the cause of diseases classified elsewhere: Secondary | ICD-10-CM

## 2020-04-07 LAB — POCT URINALYSIS DIPSTICK OB
Bilirubin, UA: NEGATIVE
Blood, UA: NEGATIVE
Glucose, UA: NEGATIVE
Ketones, UA: NEGATIVE
Leukocytes, UA: NEGATIVE
Nitrite, UA: NEGATIVE
POC,PROTEIN,UA: NEGATIVE
Spec Grav, UA: 1.01 (ref 1.010–1.025)
Urobilinogen, UA: 0.2 E.U./dL
pH, UA: 7 (ref 5.0–8.0)

## 2020-04-07 MED ORDER — METRONIDAZOLE 500 MG PO TABS: 500.0000 mg | ORAL_TABLET | Freq: Two times a day (BID) | ORAL | 0 refills | Status: DC

## 2020-04-07 NOTE — Addendum Note (Signed)
Addended by: Silvano Bilis on: 04/07/2020 04:55 PM   Modules accepted: Orders

## 2020-04-07 NOTE — Progress Notes (Signed)
ROB: Patient thinks she may have a yeast infection. Has a milky discharge sometimes associated with odor. Denies itching or burning.  Recently completed antibiotics for a UTI. Wet prep with clue cells, will treat for BV with Flagyl. Pap smear also performed today for screening. Discussed breastfeeding today (see topics below). For AFP today. Will repeat urine culture. RTC in 4 weeks, for anatomy scan then.     The following were addressed during this visit:  Breastfeeding Education - Early initiation of breastfeeding    Comments: Keeps milk supply adequate, helps contract uterus and slow bleeding, and early milk is the perfect first food and is easy to digest.   - The importance of exclusive breastfeeding    Comments: Provides antibodies, Lower risk of breast and ovarian cancers, and type-2 diabetes,Helps your body recover, Reduced chance of SIDS.   - Risks of giving your baby anything other than breast milk if you are breastfeeding    Comments: Make the baby less content with breastfeeds, may make my baby more susceptible to illness, and may reduce my milk supply.   - Exclusive breastfeeding for the first 6 months    Comments: Builds a healthy milk supply and keeps it up, protects baby from sickness and disease, and breastmilk has everything your baby needs for the first 6 months.      Marland Kitchen

## 2020-04-07 NOTE — Progress Notes (Signed)
ROB-Pt present for routine prenatal care. Pt stated having really bad cramps when she stands up. Pt also stated that she may have an yeast infection.

## 2020-04-09 LAB — AFP, SERUM, OPEN SPINA BIFIDA
AFP MoM: 1.24
AFP Value: 45.3 ng/mL
Gest. Age on Collection Date: 16.1 weeks
Maternal Age At EDD: 26.9 yr
OSBR Risk 1 IN: 10000
Test Results:: NEGATIVE
Weight: 162 [lb_av]

## 2020-04-09 LAB — URINE CULTURE, OB REFLEX

## 2020-04-09 LAB — CYTOLOGY - PAP: Diagnosis: NEGATIVE

## 2020-04-09 LAB — CULTURE, OB URINE

## 2020-05-05 ENCOUNTER — Ambulatory Visit (INDEPENDENT_AMBULATORY_CARE_PROVIDER_SITE_OTHER): Payer: Medicaid Other

## 2020-05-05 ENCOUNTER — Other Ambulatory Visit: Payer: Self-pay

## 2020-05-05 ENCOUNTER — Ambulatory Visit (INDEPENDENT_AMBULATORY_CARE_PROVIDER_SITE_OTHER): Payer: Medicaid Other | Admitting: Obstetrics and Gynecology

## 2020-05-05 VITALS — BP 117/70 | HR 97 | Wt 164.5 lb

## 2020-05-05 DIAGNOSIS — Z3482 Encounter for supervision of other normal pregnancy, second trimester: Secondary | ICD-10-CM

## 2020-05-05 DIAGNOSIS — Z3A20 20 weeks gestation of pregnancy: Secondary | ICD-10-CM

## 2020-05-05 LAB — POCT URINALYSIS DIPSTICK OB
Bilirubin, UA: NEGATIVE
Blood, UA: NEGATIVE
Glucose, UA: NEGATIVE
Ketones, UA: NEGATIVE
Nitrite, UA: NEGATIVE
Odor: NEGATIVE
POC,PROTEIN,UA: NEGATIVE
Spec Grav, UA: 1.015
Urobilinogen, UA: 0.2 U/dL
pH, UA: 7.5

## 2020-05-05 NOTE — Progress Notes (Signed)
ROB: Patient doing well.  Reports daily fetal movement.  Complains of some breast tenderness.  Recent urine culture negative.  Anatomy scan today normal anatomy.

## 2020-05-05 NOTE — Progress Notes (Signed)
ROB- Anatomy today. Pt c/o swollen breast. They are also itchy.

## 2020-05-18 ENCOUNTER — Inpatient Hospital Stay
Admission: RE | Admit: 2020-05-18 | Discharge: 2020-05-18 | Disposition: A | Discharge status: Home / Self Care | Payer: Self-pay | Source: Ambulatory Visit

## 2020-06-03 ENCOUNTER — Ambulatory Visit (INDEPENDENT_AMBULATORY_CARE_PROVIDER_SITE_OTHER): Payer: Medicaid Other | Admitting: Obstetrics and Gynecology

## 2020-06-03 ENCOUNTER — Other Ambulatory Visit: Payer: Self-pay

## 2020-06-03 ENCOUNTER — Encounter: Payer: Self-pay | Admitting: Obstetrics and Gynecology

## 2020-06-03 VITALS — BP 104/64 | HR 89 | Wt 169.3 lb

## 2020-06-03 DIAGNOSIS — Z130 Encounter for screening for diseases of the blood and blood-forming organs and certain disorders involving the immune mechanism: Secondary | ICD-10-CM

## 2020-06-03 DIAGNOSIS — Z3A24 24 weeks gestation of pregnancy: Secondary | ICD-10-CM

## 2020-06-03 DIAGNOSIS — Z3482 Encounter for supervision of other normal pregnancy, second trimester: Secondary | ICD-10-CM

## 2020-06-03 DIAGNOSIS — L740 Miliaria rubra: Secondary | ICD-10-CM

## 2020-06-03 DIAGNOSIS — Z131 Encounter for screening for diabetes mellitus: Secondary | ICD-10-CM

## 2020-06-03 LAB — POCT URINALYSIS DIPSTICK OB
Bilirubin, UA: NEGATIVE
Blood, UA: NEGATIVE
Glucose, UA: NEGATIVE
Ketones, UA: NEGATIVE
Leukocytes, UA: NEGATIVE
Nitrite, UA: NEGATIVE
POC,PROTEIN,UA: NEGATIVE
Spec Grav, UA: 1.015 (ref 1.010–1.025)
Urobilinogen, UA: 0.2 E.U./dL
pH, UA: 7.5 (ref 5.0–8.0)

## 2020-06-03 NOTE — Progress Notes (Signed)
ROB-Pt present for routine prenatal care. Pt stated having rash on chest and under breast. Pt stated that it has cleared up a little. Pt also would like to be reached to see if her bv has cleared up.

## 2020-06-03 NOTE — Progress Notes (Signed)
ROB: Patient complains of rash that spread from her breasts to her neck last week, was itchy.  Has been resolving without issues. Advised that she could use hydrocortisone cream for residual rash. Also desires to check if BV infection is gone,has completed antibiotic course, denies current complaints. Vaginal exam with small amount of thin discharge, no odor. RTC in 4 weeks, for 28 week labs at that time.    The following were addressed during this visit:  Breastfeeding Education - Nonpharmacological pain relief methods for labor    Comments: Deep breathing, focusing on pleasant things, movement and walking, heating pads or cold compress, massage and relaxation, continuous support from someone you trust, and Doulas   - The importance of early skin-to-skin contact    Comments: Keeps baby warm and secure, helps keep baby's blood sugar up and breathing steady, easier to bond and breastfeed, and helps calm baby.  - Rooming-in on a 24-hour basis    Comments: Easier to learn baby's feeding cues, easier to bond and get to know each other, and encourages milk production.   - Feeding on demand or baby-led feeding    Comments: Helps prevent breastfeeding complications, helps bring in good milk supply, prevents under or overfeeding, and helps baby feel content and satisfied

## 2020-07-01 ENCOUNTER — Ambulatory Visit (INDEPENDENT_AMBULATORY_CARE_PROVIDER_SITE_OTHER): Payer: Medicaid Other | Admitting: Obstetrics and Gynecology

## 2020-07-01 ENCOUNTER — Other Ambulatory Visit: Payer: Medicaid Other

## 2020-07-01 ENCOUNTER — Encounter: Payer: Self-pay | Admitting: Obstetrics and Gynecology

## 2020-07-01 VITALS — BP 99/64 | HR 101 | Wt 180.9 lb

## 2020-07-01 DIAGNOSIS — Z131 Encounter for screening for diabetes mellitus: Secondary | ICD-10-CM

## 2020-07-01 DIAGNOSIS — Z3482 Encounter for supervision of other normal pregnancy, second trimester: Secondary | ICD-10-CM | POA: Diagnosis not present

## 2020-07-01 DIAGNOSIS — Z3A28 28 weeks gestation of pregnancy: Secondary | ICD-10-CM | POA: Diagnosis not present

## 2020-07-01 DIAGNOSIS — Z23 Encounter for immunization: Secondary | ICD-10-CM | POA: Diagnosis not present

## 2020-07-01 DIAGNOSIS — Z130 Encounter for screening for diseases of the blood and blood-forming organs and certain disorders involving the immune mechanism: Secondary | ICD-10-CM

## 2020-07-01 LAB — POCT URINALYSIS DIPSTICK OB
Bilirubin, UA: NEGATIVE
Blood, UA: NEGATIVE
Glucose, UA: NEGATIVE
Ketones, UA: NEGATIVE
Leukocytes, UA: NEGATIVE
Nitrite, UA: NEGATIVE
POC,PROTEIN,UA: NEGATIVE
Spec Grav, UA: 1.01 (ref 1.010–1.025)
Urobilinogen, UA: 0.2 E.U./dL
pH, UA: 7 (ref 5.0–8.0)

## 2020-07-01 NOTE — Progress Notes (Signed)
ROB: Patient without complaints.  She reports her rash is improved but it tends to "come and go".  1 hour GCT today.

## 2020-07-01 NOTE — Addendum Note (Signed)
Addended by: Dorian Pod on: 07/01/2020 01:02 PM   Modules accepted: Orders

## 2020-07-01 NOTE — Progress Notes (Signed)
Patient comes in today for ROB visit. I have went over ready set baby questions with patient. She is wanting to supplement between breast milk and formula. Patient does not want a tubal after delivery. We have discussed BC after delivery. I have given her some options to think about. Patient states she has only been on Depo before. Does not want IUD.

## 2020-07-02 LAB — CBC
Hematocrit: 32.7 % — ABNORMAL LOW (ref 34.0–46.6)
Hemoglobin: 10.7 g/dL — ABNORMAL LOW (ref 11.1–15.9)
MCH: 31.7 pg (ref 26.6–33.0)
MCHC: 32.7 g/dL (ref 31.5–35.7)
MCV: 97 fL (ref 79–97)
Platelets: 268 10*3/uL (ref 150–450)
RBC: 3.38 x10E6/uL — ABNORMAL LOW (ref 3.77–5.28)
RDW: 11.4 % — ABNORMAL LOW (ref 11.7–15.4)
WBC: 8.8 10*3/uL (ref 3.4–10.8)

## 2020-07-02 LAB — GLUCOSE, 1 HOUR GESTATIONAL: Gestational Diabetes Screen: 107 mg/dL (ref 65–139)

## 2020-07-02 LAB — RPR: RPR Ser Ql: NONREACTIVE

## 2020-07-22 NOTE — Progress Notes (Signed)
ROB-Pt present for routine prenatal care. Pt c/o of vaginal pain and pressure.  

## 2020-07-22 NOTE — Patient Instructions (Signed)
WHAT OB PATIENTS CAN EXPECT   Confirmation of pregnancy and ultrasound ordered if medically indicated-[redacted] weeks gestation  New OB (NOB) intake with nurse and New OB (NOB) labs- [redacted] weeks gestation  New OB (NOB) physical examination with provider- 11/[redacted] weeks gestation  Flu vaccine-[redacted] weeks gestation  Anatomy scan-[redacted] weeks gestation  Glucose tolerance test, blood work to test for anemia, T-dap vaccine-[redacted] weeks gestation  Vaginal swabs/cultures-STD/Group B strep-[redacted] weeks gestation  Appointments every 4 weeks until 28 weeks  Every 2 weeks from 28 weeks until 36 weeks  Weekly visits from 36 weeks until delivery  Third Trimester of Pregnancy  The third trimester is from week 28 through week 40 (months 7 through 9). This trimester is when your unborn baby (fetus) is growing very fast. At the end of the ninth month, the unborn baby is about 20 inches in length. It weighs about 6-10 pounds. Follow these instructions at home: Medicines  Take over-the-counter and prescription medicines only as told by your doctor. Some medicines are safe and some medicines are not safe during pregnancy.  Take a prenatal vitamin that contains at least 600 micrograms (mcg) of folic acid.  If you have trouble pooping (constipation), take medicine that will make your stool soft (stool softener) if your doctor approves. Eating and drinking   Eat regular, healthy meals.  Avoid raw meat and uncooked cheese.  If you get low calcium from the food you eat, talk to your doctor about taking a daily calcium supplement.  Eat four or five small meals rather than three large meals a day.  Avoid foods that are high in fat and sugars, such as fried and sweet foods.  To prevent constipation: ? Eat foods that are high in fiber, like fresh fruits and vegetables, whole grains, and beans. ? Drink enough fluids to keep your pee (urine) clear or pale yellow. Activity  Exercise only as told by your doctor. Stop  exercising if you start to have cramps.  Avoid heavy lifting, wear low heels, and sit up straight.  Do not exercise if it is too hot, too humid, or if you are in a place of great height (high altitude).  You may continue to have sex unless your doctor tells you not to. Relieving pain and discomfort  Wear a good support bra if your breasts are tender.  Take frequent breaks and rest with your legs raised if you have leg cramps or low back pain.  Take warm water baths (sitz baths) to soothe pain or discomfort caused by hemorrhoids. Use hemorrhoid cream if your doctor approves.  If you develop puffy, bulging veins (varicose veins) in your legs: ? Wear support hose or compression stockings as told by your doctor. ? Raise (elevate) your feet for 15 minutes, 3-4 times a day. ? Limit salt in your food. Safety  Wear your seat belt when driving.  Make a list of emergency phone numbers, including numbers for family, friends, the hospital, and police and fire departments. Preparing for your baby's arrival To prepare for the arrival of your baby:  Take prenatal classes.  Practice driving to the hospital.  Visit the hospital and tour the maternity area.  Talk to your work about taking leave once the baby comes.  Pack your hospital bag.  Prepare the baby's room.  Go to your doctor visits.  Buy a rear-facing car seat. Learn how to install it in your car. General instructions  Do not use hot tubs, steam rooms, or saunas.  Do not use any products that contain nicotine or tobacco, such as cigarettes and e-cigarettes. If you need help quitting, ask your doctor.  Do not drink alcohol.  Do not douche or use tampons or scented sanitary pads.  Do not cross your legs for long periods of time.  Do not travel for long distances unless you must. Only do so if your doctor says it is okay.  Visit your dentist if you have not gone during your pregnancy. Use a soft toothbrush to brush your  teeth. Be gentle when you floss.  Avoid cat litter boxes and soil used by cats. These carry germs that can cause birth defects in the baby and can cause a loss of your baby (miscarriage) or stillbirth.  Keep all your prenatal visits as told by your doctor. This is important. Contact a doctor if:  You are not sure if you are in labor or if your water has broken.  You are dizzy.  You have mild cramps or pressure in your lower belly.  You have a nagging pain in your belly area.  You continue to feel sick to your stomach, you throw up, or you have watery poop.  You have bad smelling fluid coming from your vagina.  You have pain when you pee. Get help right away if:  You have a fever.  You are leaking fluid from your vagina.  You are spotting or bleeding from your vagina.  You have severe belly cramps or pain.  You lose or gain weight quickly.  You have trouble catching your breath and have chest pain.  You notice sudden or extreme puffiness (swelling) of your face, hands, ankles, feet, or legs.  You have not felt the baby move in over an hour.  You have severe headaches that do not go away with medicine.  You have trouble seeing.  You are leaking, or you are having a gush of fluid, from your vagina before you are 37 weeks.  You have regular belly spasms (contractions) before you are 37 weeks. Summary  The third trimester is from week 28 through week 40 (months 7 through 9). This time is when your unborn baby is growing very fast.  Follow your doctor's advice about medicine, food, and activity.  Get ready for the arrival of your baby by taking prenatal classes, getting all the baby items ready, preparing the baby's room, and visiting your doctor to be checked.  Get help right away if you are bleeding from your vagina, or you have chest pain and trouble catching your breath, or if you have not felt your baby move in over an hour. This information is not intended to  replace advice given to you by your health care provider. Make sure you discuss any questions you have with your health care provider. Document Revised: 02/28/2019 Document Reviewed: 12/13/2016 Elsevier Patient Education  [employer]. Common Medications Safe in Pregnancy  Acne:      Constipation:  Benzoyl Peroxide     Colace  Clindamycin      Dulcolax Suppository  Topica Erythromycin     Fibercon  Salicylic Acid      Metamucil         Miralax AVOID:        Senakot   Accutane    Cough:  Retin-A       Cough Drops  Tetracycline      Phenergan w/ Codeine if Rx  Minocycline      Robitussin (Plain &  DM)  Antibiotics:     Crabs/Lice:  Ceclor       RID  Cephalosporins    AVOID:  E-Mycins      Kwell  Keflex  Macrobid/Macrodantin   Diarrhea:  Penicillin      Kao-Pectate  Zithromax      Imodium AD         PUSH FLUIDS AVOID:       Cipro     Fever:  Tetracycline      Tylenol (Regular or Extra  Minocycline       Strength)  Levaquin      Extra Strength-Do not          Exceed 8 tabs/24 hrs Caffeine:        <250m/day (equiv. To 1 cup of coffee or  approx. 3 12 oz sodas)         Gas: Cold/Hayfever:       Gas-X  Benadryl      Mylicon  Claritin       Phazyme  **Claritin-D        Chlor-Trimeton    Headaches:  Dimetapp      ASA-Free Excedrin  Drixoral-Non-Drowsy     Cold Compress  Mucinex (Guaifenasin)     Tylenol (Regular or Extra  Sudafed/Sudafed-12 Hour     Strength)  **Sudafed PE Pseudoephedrine   Tylenol Cold & Sinus     Vicks Vapor Rub  Zyrtec  **AVOID if Problems With Blood Pressure         Heartburn: Avoid lying down for at least 1 hour after meals  Aciphex      Maalox     Rash:  Milk of Magnesia     Benadryl    Mylanta       1% Hydrocortisone Cream  Pepcid  Pepcid Complete   Sleep Aids:  Prevacid      Ambien   Prilosec       Benadryl  Rolaids       Chamomile Tea  Tums (Limit 4/day)     Unisom         Tylenol PM         Warm milk-add vanilla  or  Hemorrhoids:       Sugar for taste  Anusol/Anusol H.C.  (RX: Analapram 2.5%)  Sugar Substitutes:  Hydrocortisone OTC     Ok in moderation  Preparation H      Tucks        Vaseline lotion applied to tissue with wiping    Herpes:     Throat:  Acyclovir      Oragel  Famvir  Valtrex     Vaccines:         Flu Shot Leg Cramps:       *Gardasil  Benadryl      Hepatitis A         Hepatitis B Nasal Spray:       Pneumovax  Saline Nasal Spray     Polio Booster         Tetanus Nausea:       Tuberculosis test or PPD  Vitamin B6 25 mg TID   AVOID:    Dramamine      *Gardasil  Emetrol       Live Poliovirus  Ginger Root 250 mg QID    MMR (measles, mumps &  High Complex Carbs @ Bedtime    rebella)  Sea Bands-Accupressure    Varicella (Chickenpox)  Unisom 1/2 tab TID     *  No known complications           If received before Pain:         Known pregnancy;   Darvocet       Resume series after  Lortab        Delivery  Percocet    Yeast:   Tramadol      Femstat  Tylenol 3      Gyne-lotrimin  Ultram       Monistat  Vicodin           MISC:         All Sunscreens           Hair Coloring/highlights          Insect Repellant's          (Including DEET)         Mystic Tans Breastfeeding Tips for a Good Latch Latching is how your baby's mouth attaches to your nipple to breastfeed. It is an important part of breastfeeding. Your baby may have trouble latching for a number of reasons. A poor latch may cause you to have cracked or sore nipples or other problems. Follow these instructions at home: How to position your baby  Find a comfortable place to sit or lie down. Your neck and back should be well supported.  If you are seated, place a pillow or rolled-up blanket under your baby. This will bring him or her to the level of your breast.  Make sure that your baby's belly (abdomen) is facing your belly.  Try different positions to find one that works best for you and your baby. How to help  your baby latch   To start, gently rub your breast. Move your fingertips in a circle as you massage from your chest wall toward your nipple. This helps milk flow. Keep doing this during feeding if needed.  Position your breast. Hold your breast with four fingers underneath and your thumb above your nipple. Keep your fingers away from your nipple and your baby's mouth. Follow these steps to help your baby latch: 1. Rub your baby's lips gently with your finger or nipple. 2. When your baby's mouth is open wide enough, quickly bring your baby to your breast and place your whole nipple into your baby's mouth. Place as much of the colored area around your nipple (areola)as possible into your baby's mouth. 3. Your baby's tongue should be between his or her lower gum and your breast. 4. You should be able to see more areola above your baby's upper lip than below the lower lip. 5. When your baby starts sucking, you will feel a gentle pull on your nipple. You should not feel any pain. Be patient. It is common for a baby to suck for about 2-3 minutes to start the flow of breast milk. 6. Make sure that your baby's mouth is in the right position around your nipple. Your baby's lips should make a seal on your breast and be turned outward.  General instructions  Look for these signs that your baby has latched on to your nipple: ? The baby is quietly tugging or sucking without causing you pain. ? You hear the baby swallow after every 3 or 4 sucks. ? You see movement above and in front of the baby's ears while he or she is sucking.  Be aware of these signs that your baby has not latched on to your nipple: ? The baby makes sucking sounds or smacking sounds  while feeding. ? You have nipple pain.  If your baby is not latched well, put your little finger between your baby's gums and your nipple. This will break the seal. Then try to help your baby latch again.  If you keep having problems, get help from a  breastfeeding specialist (Advertising copywriter). Contact a doctor if:  You have cracking or soreness in your nipples that lasts longer than 1 week.  You have nipple pain.  Your breasts are filled with too much milk (engorgement), and this does not improve after 48-72 hours.  You have a plugged milk duct and a fever.  You follow the tips for a good latch but you keep having problems or concerns.  You have a pus-like fluid coming from your breast.  Your baby is not gaining weight.  Your baby loses weight. Summary  Latching is how your baby's mouth attaches to your nipple to breastfeed.  Try different positions for breastfeeding to find one that works best for you and your baby.  A poor latch may cause you to have cracked or sore nipples or other problems. This information is not intended to replace advice given to you by your health care provider. Make sure you discuss any questions you have with your health care provider. Document Revised: 02/27/2019 Document Reviewed: 06/14/2017 Elsevier Patient Education  2020 [employer]. Breastfeeding  Choosing to breastfeed is one of the best decisions you can make for yourself and your baby. A change in hormones during pregnancy causes your breasts to make breast milk in your milk-producing glands. Hormones prevent breast milk from being released before your baby is born. They also prompt milk flow after birth. Once breastfeeding has begun, thoughts of your baby, as well as his or her sucking or crying, can stimulate the release of milk from your milk-producing glands. Benefits of breastfeeding Research shows that breastfeeding offers many health benefits for infants and mothers. It also offers a cost-free and convenient way to feed your baby. For your baby  Your first milk (colostrum) helps your baby's digestive system to function better.  Special cells in your milk (antibodies) help your baby to fight off infections.  Breastfed babies  are less likely to develop asthma, allergies, obesity, or type 2 diabetes. They are also at lower risk for sudden infant death syndrome (SIDS).  Nutrients in breast milk are better able to meet your baby's needs compared to infant formula.  Breast milk improves your baby's brain development. For you  Breastfeeding helps to create a very special bond between you and your baby.  Breastfeeding is convenient. Breast milk costs nothing and is always available at the correct temperature.  Breastfeeding helps to burn calories. It helps you to lose the weight that you gained during pregnancy.  Breastfeeding makes your uterus return faster to its size before pregnancy. It also slows bleeding (lochia) after you give birth.  Breastfeeding helps to lower your risk of developing type 2 diabetes, osteoporosis, rheumatoid arthritis, cardiovascular disease, and breast, ovarian, uterine, and endometrial cancer later in life. Breastfeeding basics Starting breastfeeding  Find a comfortable place to sit or lie down, with your neck and back well-supported.  Place a pillow or a rolled-up blanket under your baby to bring him or her to the level of your breast (if you are seated). Nursing pillows are specially designed to help support your arms and your baby while you breastfeed.  Make sure that your baby's tummy (abdomen) is facing your abdomen.  Gently massage  your breast. With your fingertips, massage from the outer edges of your breast inward toward the nipple. This encourages milk flow. If your milk flows slowly, you may need to continue this action during the feeding.  Support your breast with 4 fingers underneath and your thumb above your nipple (make the letter "C" with your hand). Make sure your fingers are well away from your nipple and your baby's mouth.  Stroke your baby's lips gently with your finger or nipple.  When your baby's mouth is open wide enough, quickly bring your baby to your breast,  placing your entire nipple and as much of the areola as possible into your baby's mouth. The areola is the colored area around your nipple. ? More areola should be visible above your baby's upper lip than below the lower lip. ? Your baby's lips should be opened and extended outward (flanged) to ensure an adequate, comfortable latch. ? Your baby's tongue should be between his or her lower gum and your breast.  Make sure that your baby's mouth is correctly positioned around your nipple (latched). Your baby's lips should create a seal on your breast and be turned out (everted).  It is common for your baby to suck about 2-3 minutes in order to start the flow of breast milk. Latching Teaching your baby how to latch onto your breast properly is very important. An improper latch can cause nipple pain, decreased milk supply, and poor weight gain in your baby. Also, if your baby is not latched onto your nipple properly, he or she may swallow some air during feeding. This can make your baby fussy. Burping your baby when you switch breasts during the feeding can help to get rid of the air. However, teaching your baby to latch on properly is still the best way to prevent fussiness from swallowing air while breastfeeding. Signs that your baby has successfully latched onto your nipple  Silent tugging or silent sucking, without causing you pain. Infant's lips should be extended outward (flanged).  Swallowing heard between every 3-4 sucks once your milk has started to flow (after your let-down milk reflex occurs).  Muscle movement above and in front of his or her ears while sucking. Signs that your baby has not successfully latched onto your nipple  Sucking sounds or smacking sounds from your baby while breastfeeding.  Nipple pain. If you think your baby has not latched on correctly, slip your finger into the corner of your baby's mouth to break the suction and place it between your baby's gums. Attempt to  start breastfeeding again. Signs of successful breastfeeding Signs from your baby  Your baby will gradually decrease the number of sucks or will completely stop sucking.  Your baby will fall asleep.  Your baby's body will relax.  Your baby will retain a small amount of milk in his or her mouth.  Your baby will let go of your breast by himself or herself. Signs from you  Breasts that have increased in firmness, weight, and size 1-3 hours after feeding.  Breasts that are softer immediately after breastfeeding.  Increased milk volume, as well as a change in milk consistency and color by the fifth day of breastfeeding.  Nipples that are not sore, cracked, or bleeding. Signs that your baby is getting enough milk  Wetting at least 1-2 diapers during the first 24 hours after birth.  Wetting at least 5-6 diapers every 24 hours for the first week after birth. The urine should be clear or  pale yellow by the age of 5 days.  Wetting 6-8 diapers every 24 hours as your baby continues to grow and develop.  At least 3 stools in a 24-hour period by the age of 5 days. The stool should be soft and yellow.  At least 3 stools in a 24-hour period by the age of 7 days. The stool should be seedy and yellow.  No loss of weight greater than 10% of birth weight during the first 3 days of life.  Average weight gain of 4-7 oz (113-198 g) per week after the age of 4 days.  Consistent daily weight gain by the age of 5 days, without weight loss after the age of 2 weeks. After a feeding, your baby may spit up a small amount of milk. This is normal. Breastfeeding frequency and duration Frequent feeding will help you make more milk and can prevent sore nipples and extremely full breasts (breast engorgement). Breastfeed when you feel the need to reduce the fullness of your breasts or when your baby shows signs of hunger. This is called "breastfeeding on demand." Signs that your baby is hungry  include:  Increased alertness, activity, or restlessness.  Movement of the head from side to side.  Opening of the mouth when the corner of the mouth or cheek is stroked (rooting).  Increased sucking sounds, smacking lips, cooing, sighing, or squeaking.  Hand-to-mouth movements and sucking on fingers or hands.  Fussing or crying. Avoid introducing a pacifier to your baby in the first 4-6 weeks after your baby is born. After this time, you may choose to use a pacifier. Research has shown that pacifier use during the first year of a baby's life decreases the risk of sudden infant death syndrome (SIDS). Allow your baby to feed on each breast as long as he or she wants. When your baby unlatches or falls asleep while feeding from the first breast, offer the second breast. Because newborns are often sleepy in the first few weeks of life, you may need to awaken your baby to get him or her to feed. Breastfeeding times will vary from baby to baby. However, the following rules can serve as a guide to help you make sure that your baby is properly fed:  Newborns (babies 49 weeks of age or younger) may breastfeed every 1-3 hours.  Newborns should not go without breastfeeding for longer than 3 hours during the day or 5 hours during the night.  You should breastfeed your baby a minimum of 8 times in a 24-hour period. Breast milk pumping     Pumping and storing breast milk allows you to make sure that your baby is exclusively fed your breast milk, even at times when you are unable to breastfeed. This is especially important if you go back to work while you are still breastfeeding, or if you are not able to be present during feedings. Your lactation consultant can help you find a method of pumping that works best for you and give you guidelines about how long it is safe to store breast milk. Caring for your breasts while you breastfeed Nipples can become dry, cracked, and sore while breastfeeding. The  following recommendations can help keep your breasts moisturized and healthy:  Avoid using soap on your nipples.  Wear a supportive bra designed especially for nursing. Avoid wearing underwire-style bras or extremely tight bras (sports bras).  Air-dry your nipples for 3-4 minutes after each feeding.  Use only cotton bra pads to absorb leaked  breast milk. Leaking of breast milk between feedings is normal.  Use lanolin on your nipples after breastfeeding. Lanolin helps to maintain your skin's normal moisture barrier. Pure lanolin is not harmful (not toxic) to your baby. You may also hand express a few drops of breast milk and gently massage that milk into your nipples and allow the milk to air-dry. In the first few weeks after giving birth, some women experience breast engorgement. Engorgement can make your breasts feel heavy, warm, and tender to the touch. Engorgement peaks within 3-5 days after you give birth. The following recommendations can help to ease engorgement:  Completely empty your breasts while breastfeeding or pumping. You may want to start by applying warm, moist heat (in the shower or with warm, water-soaked hand towels) just before feeding or pumping. This increases circulation and helps the milk flow. If your baby does not completely empty your breasts while breastfeeding, pump any extra milk after he or she is finished.  Apply ice packs to your breasts immediately after breastfeeding or pumping, unless this is too uncomfortable for you. To do this: ? Put ice in a plastic bag. ? Place a towel between your skin and the bag. ? Leave the ice on for 20 minutes, 2-3 times a day.  Make sure that your baby is latched on and positioned properly while breastfeeding. If engorgement persists after 48 hours of following these recommendations, contact your health care provider or a Science writer. Overall health care recommendations while breastfeeding  Eat 3 healthy meals and 3  snacks every day. Well-nourished mothers who are breastfeeding need an additional 450-500 calories a day. You can meet this requirement by increasing the amount of a balanced diet that you eat.  Drink enough water to keep your urine pale yellow or clear.  Rest often, relax, and continue to take your prenatal vitamins to prevent fatigue, stress, and low vitamin and mineral levels in your body (nutrient deficiencies).  Do not use any products that contain nicotine or tobacco, such as cigarettes and e-cigarettes. Your baby may be harmed by chemicals from cigarettes that pass into breast milk and exposure to secondhand smoke. If you need help quitting, ask your health care provider.  Avoid alcohol.  Do not use illegal drugs or marijuana.  Talk with your health care provider before taking any medicines. These include over-the-counter and prescription medicines as well as vitamins and herbal supplements. Some medicines that may be harmful to your baby can pass through breast milk.  It is possible to become pregnant while breastfeeding. If birth control is desired, ask your health care provider about options that will be safe while breastfeeding your baby. Where to find more information: [employer]: www.llli.org Contact a health care provider if:  You feel like you want to stop breastfeeding or have become frustrated with breastfeeding.  Your nipples are cracked or bleeding.  Your breasts are red, tender, or warm.  You have: ? Painful breasts or nipples. ? A swollen area on either breast. ? A fever or chills. ? Nausea or vomiting. ? Drainage other than breast milk from your nipples.  Your breasts do not become full before feedings by the fifth day after you give birth.  You feel sad and depressed.  Your baby is: ? Too sleepy to eat well. ? Having trouble sleeping. ? More than 9 week old and wetting fewer than 6 diapers in a 24-hour period. ? Not gaining weight by 64  days of age.  Your baby has fewer than 3 stools in a 24-hour period.  Your baby's skin or the white parts of his or her eyes become yellow. Get help right away if:  Your baby is overly tired (lethargic) and does not want to wake up and feed.  Your baby develops an unexplained fever. Summary  Breastfeeding offers many health benefits for infant and mothers.  Try to breastfeed your infant when he or she shows early signs of hunger.  Gently tickle or stroke your baby's lips with your finger or nipple to allow the baby to open his or her mouth. Bring the baby to your breast. Make sure that much of the areola is in your baby's mouth. Offer one side and burp the baby before you offer the other side.  Talk with your health care provider or lactation consultant if you have questions or you face problems as you breastfeed. This information is not intended to replace advice given to you by your health care provider. Make sure you discuss any questions you have with your health care provider. Document Revised: 02/01/2018 Document Reviewed: 12/09/2016 Elsevier Patient Education  [place].

## 2020-07-23 ENCOUNTER — Ambulatory Visit (INDEPENDENT_AMBULATORY_CARE_PROVIDER_SITE_OTHER): Payer: Medicaid Other | Admitting: Obstetrics and Gynecology

## 2020-07-23 ENCOUNTER — Other Ambulatory Visit: Payer: Self-pay

## 2020-07-23 ENCOUNTER — Encounter: Payer: Self-pay | Admitting: Obstetrics and Gynecology

## 2020-07-23 VITALS — BP 101/64 | HR 92 | Wt 183.7 lb

## 2020-07-23 DIAGNOSIS — Z3A31 31 weeks gestation of pregnancy: Secondary | ICD-10-CM

## 2020-07-23 DIAGNOSIS — Z3483 Encounter for supervision of other normal pregnancy, third trimester: Secondary | ICD-10-CM

## 2020-07-23 NOTE — Progress Notes (Signed)
ROB: Doing ok overall.  Notes she did get iron pills for anemia.  Desires to discuss plan of care of delivery.  Inquires about a C-section so she can schedule delivery as her husband is a truck driver and is away on the road a lot. Discussed that she did not require a C-section for this indication and could instead be scheduled for social induction after 39 weeks. All other general pregnancy questions answered. Plans for circumcision for female infant. Discussed local Pediatricians in the area as her child currently goes to Peds in [place], [place]. RTC in 2 weeks.    The following were addressed during this visit:  Breastfeeding Education - Frequent feeding to help assure optimal milk production    Comments: Making a full supply of milk requires frequent removal of milk from breasts, infant will eat 8-12 times in 24 hours, if separated from infant use breast massage, hand expression and/ or pumping to remove milk from breasts.   - Effective positioning and attachment    Comments: Helps my baby to get enough breast milk, helps to produce an adequate milk supply, and helps prevent nipple pain and damage   - Exclusive breastfeeding for the first 6 months    Comments: Builds a healthy milk supply and keeps it up, protects baby from sickness and disease, and breastmilk has everything your baby needs for the first 6 months.

## 2020-08-12 ENCOUNTER — Other Ambulatory Visit: Payer: Self-pay

## 2020-08-12 ENCOUNTER — Encounter: Payer: Self-pay | Admitting: Obstetrics and Gynecology

## 2020-08-12 ENCOUNTER — Ambulatory Visit (INDEPENDENT_AMBULATORY_CARE_PROVIDER_SITE_OTHER): Payer: Medicaid Other | Admitting: Obstetrics and Gynecology

## 2020-08-12 VITALS — BP 101/65 | HR 89 | Wt 188.6 lb

## 2020-08-12 DIAGNOSIS — Z3A34 34 weeks gestation of pregnancy: Secondary | ICD-10-CM

## 2020-08-12 DIAGNOSIS — Z3483 Encounter for supervision of other normal pregnancy, third trimester: Secondary | ICD-10-CM | POA: Diagnosis not present

## 2020-08-12 LAB — POCT URINALYSIS DIPSTICK OB
Bilirubin, UA: NEGATIVE
Blood, UA: NEGATIVE
Glucose, UA: NEGATIVE
Ketones, UA: NEGATIVE
Leukocytes, UA: NEGATIVE
Nitrite, UA: NEGATIVE
Spec Grav, UA: 1.015 (ref 1.010–1.025)
Urobilinogen, UA: 0.2 E.U./dL
pH, UA: 6.5 (ref 5.0–8.0)

## 2020-08-12 NOTE — Progress Notes (Signed)
ROB: Generally well.  Has occasional headaches relieved by Tylenol.  We discussed increased hydration and decreased caffeine use.  Cultures next visit.

## 2020-08-12 NOTE — Patient Instructions (Signed)
Braxton Hicks Contractions °Contractions of the uterus can occur throughout pregnancy, but they are not always a sign that you are in labor. You may have practice contractions called Braxton Hicks contractions. These false labor contractions are sometimes confused with true labor. °What are Braxton Hicks contractions? °Braxton Hicks contractions are tightening movements that occur in the muscles of the uterus before labor. Unlike true labor contractions, these contractions do not result in opening (dilation) and thinning of the cervix. Toward the end of pregnancy (32-34 weeks), Braxton Hicks contractions can happen more often and may become stronger. These contractions are sometimes difficult to tell apart from true labor because they can be very uncomfortable. You should not feel embarrassed if you go to the hospital with false labor. °Sometimes, the only way to tell if you are in true labor is for your health care provider to look for changes in the cervix. The health care provider will do a physical exam and may monitor your contractions. If you are not in true labor, the exam should show that your cervix is not dilating and your water has not broken. °If there are no other health problems associated with your pregnancy, it is completely safe for you to be sent home with false labor. You may continue to have Braxton Hicks contractions until you go into true labor. °How to tell the difference between true labor and false labor °True labor °· Contractions last 30-70 seconds. °· Contractions become very regular. °· Discomfort is usually felt in the top of the uterus, and it spreads to the lower abdomen and low back. °· Contractions do not go away with walking. °· Contractions usually become more intense and increase in frequency. °· The cervix dilates and gets thinner. °False labor °· Contractions are usually shorter and not as strong as true labor contractions. °· Contractions are usually irregular. °· Contractions  are often felt in the front of the lower abdomen and in the groin. °· Contractions may go away when you walk around or change positions while lying down. °· Contractions get weaker and are shorter-lasting as time goes on. °· The cervix usually does not dilate or become thin. °Follow these instructions at home: ° °· Take over-the-counter and prescription medicines only as told by your health care provider. °· Keep up with your usual exercises and follow other instructions from your health care provider. °· Eat and drink lightly if you think you are going into labor. °· If Braxton Hicks contractions are making you uncomfortable: °? Change your position from lying down or resting to walking, or change from walking to resting. °? Sit and rest in a tub of warm water. °? Drink enough fluid to keep your urine pale yellow. Dehydration may cause these contractions. °? Do slow and deep breathing several times an hour. °· Keep all follow-up prenatal visits as told by your health care provider. This is important. °Contact a health care provider if: °· You have a fever. °· You have continuous pain in your abdomen. °Get help right away if: °· Your contractions become stronger, more regular, and closer together. °· You have fluid leaking or gushing from your vagina. °· You pass blood-tinged mucus (bloody show). °· You have bleeding from your vagina. °· You have low back pain that you never had before. °· You feel your baby’s head pushing down and causing pelvic pressure. °· Your baby is not moving inside you as much as it used to. °Summary °· Contractions that occur before labor are   called Braxton Hicks contractions, false labor, or practice contractions. °· Braxton Hicks contractions are usually shorter, weaker, farther apart, and less regular than true labor contractions. True labor contractions usually become progressively stronger and regular, and they become more frequent. °· Manage discomfort from Braxton Hicks contractions  by changing position, resting in a warm bath, drinking plenty of water, or practicing deep breathing. °This information is not intended to replace advice given to you by your health care provider. Make sure you discuss any questions you have with your health care provider. °Document Revised: 10/20/2017 Document Reviewed: 03/23/2017 °Elsevier Patient Education © 2020 Elsevier Inc. ° °

## 2020-08-21 DIAGNOSIS — Z419 Encounter for procedure for purposes other than remedying health state, unspecified: Secondary | ICD-10-CM | POA: Diagnosis not present

## 2020-08-27 ENCOUNTER — Ambulatory Visit (INDEPENDENT_AMBULATORY_CARE_PROVIDER_SITE_OTHER): Payer: Medicaid Other | Admitting: Obstetrics and Gynecology

## 2020-08-27 ENCOUNTER — Other Ambulatory Visit: Payer: Self-pay

## 2020-08-27 ENCOUNTER — Encounter: Payer: Self-pay | Admitting: Obstetrics and Gynecology

## 2020-08-27 VITALS — BP 100/64 | HR 88 | Wt 189.1 lb

## 2020-08-27 DIAGNOSIS — Z3483 Encounter for supervision of other normal pregnancy, third trimester: Secondary | ICD-10-CM | POA: Diagnosis not present

## 2020-08-27 DIAGNOSIS — Z23 Encounter for immunization: Secondary | ICD-10-CM

## 2020-08-27 DIAGNOSIS — Z3685 Encounter for antenatal screening for Streptococcus B: Secondary | ICD-10-CM

## 2020-08-27 DIAGNOSIS — Z3A36 36 weeks gestation of pregnancy: Secondary | ICD-10-CM

## 2020-08-27 DIAGNOSIS — B009 Herpesviral infection, unspecified: Secondary | ICD-10-CM

## 2020-08-27 MED ORDER — VALACYCLOVIR HCL 500 MG PO TABS: 500.0000 mg | ORAL_TABLET | Freq: Two times a day (BID) | ORAL | 0 refills | Status: DC

## 2020-08-27 NOTE — Progress Notes (Signed)
ROB: Notes some swelling in legs at night but goes away.  Also noting pelvic pressure and Braxton Hicks. Reassured this was normal for this stage in pregnancy. Patient desires to discuss possible elective IOL. She notes husband is a [employer] architect and sometimes has to drive as far as [place] or [place] and is gone for days. Can schedule for 39 week induction (scheduled 09/18/20 at midnight). 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  Needs prophylaxis. Desires Pediatrician list. Flu vaccine given today. 36 week labs done today. RTC in 1 week.

## 2020-08-27 NOTE — Patient Instructions (Addendum)
Common Medications Safe in Pregnancy  Acne:      Constipation:  Benzoyl Peroxide     Colace  Clindamycin      Dulcolax Suppository  Topica Erythromycin     Fibercon  Salicylic Acid      Metamucil         Miralax AVOID:        Senakot   Accutane    Cough:  Retin-A       Cough Drops  Tetracycline      Phenergan w/ Codeine if Rx  Minocycline      Robitussin (Plain & DM)  Antibiotics:     Crabs/Lice:  Ceclor       RID  Cephalosporins    AVOID:  E-Mycins      Kwell  Keflex  Macrobid/Macrodantin   Diarrhea:  Penicillin      Kao-Pectate  Zithromax      Imodium AD         PUSH FLUIDS AVOID:       Cipro     Fever:  Tetracycline      Tylenol (Regular or Extra  Minocycline       Strength)  Levaquin      Extra Strength-Do not          Exceed 8 tabs/24 hrs Caffeine:        <200mg/day (equiv. To 1 cup of coffee or  approx. 3 12 oz sodas)         Gas: Cold/Hayfever:       Gas-X  Benadryl      Mylicon  Claritin       Phazyme  **Claritin-D        Chlor-Trimeton    Headaches:  Dimetapp      ASA-Free Excedrin  Drixoral-Non-Drowsy     Cold Compress  Mucinex (Guaifenasin)     Tylenol (Regular or Extra  Sudafed/Sudafed-12 Hour     Strength)  **Sudafed PE Pseudoephedrine   Tylenol Cold & Sinus     Vicks Vapor Rub  Zyrtec  **AVOID if Problems With Blood Pressure         Heartburn: Avoid lying down for at least 1 hour after meals  Aciphex      Maalox     Rash:  Milk of Magnesia     Benadryl    Mylanta       1% Hydrocortisone Cream  Pepcid  Pepcid Complete   Sleep Aids:  Prevacid      Ambien   Prilosec       Benadryl  Rolaids       Chamomile Tea  Tums (Limit 4/day)     Unisom         Tylenol PM         Warm milk-add vanilla or  Hemorrhoids:       Sugar for taste  Anusol/Anusol H.C.  (RX: Analapram 2.5%)  Sugar Substitutes:  Hydrocortisone OTC     Ok in moderation  Preparation H      Tucks        Vaseline lotion applied to tissue with  wiping    Herpes:     Throat:  Acyclovir      Oragel  Famvir  Valtrex     Vaccines:         Flu Shot Leg Cramps:       *Gardasil  Benadryl      Hepatitis A         Hepatitis B Nasal Spray:         Pneumovax  Saline Nasal Spray     Polio Booster         Tetanus Nausea:       Tuberculosis test or PPD  Vitamin B6 25 mg TID   AVOID:    Dramamine      *Gardasil  Emetrol       Live Poliovirus  Ginger Root 250 mg QID    MMR (measles, mumps &  High Complex Carbs @ Bedtime    rebella)  Sea Bands-Accupressure    Varicella (Chickenpox)  Unisom 1/2 tab TID     *No known complications           If received before Pain:         Known pregnancy;   Darvocet       Resume series after  Lortab        Delivery  Percocet    Yeast:   Tramadol      Femstat  Tylenol 3      Gyne-lotrimin  Ultram       Monistat  Vicodin           MISC:         All Sunscreens           Hair Coloring/highlights          Insect Repellant's          (Including DEET)         Mystic Tans Influenza (Flu) Vaccine (Inactivated or Recombinant): What You Need to Know 1. Why get vaccinated? Influenza vaccine can prevent influenza (flu). Flu is a contagious disease that spreads around the [place] every year, usually between October and May. Anyone can get the flu, but it is more dangerous for some people. Infants and young children, people 57 years of age and older, pregnant women, and people with certain health conditions or a weakened immune system are at greatest risk of flu complications. Pneumonia, bronchitis, sinus infections and ear infections are examples of flu-related complications. If you have a medical condition, such as heart disease, cancer or diabetes, flu can make it worse. Flu can cause fever and chills, sore throat, muscle aches, fatigue, cough, headache, and runny or stuffy nose. Some people may have vomiting and diarrhea, though this is more common in children than adults. Each year thousands of people in  the [place] die from flu, and many more are hospitalized. Flu vaccine prevents millions of illnesses and flu-related visits to the doctor each year. 2. Influenza vaccine CDC recommends everyone 35 months of age and older get vaccinated every flu season. Children 6 months through 76 years of age may need 2 doses during a single flu season. Everyone else needs only 1 dose each flu season. It takes about 2 weeks for protection to develop after vaccination. There are many flu viruses, and they are always changing. Each year a new flu vaccine is made to protect against three or four viruses that are likely to cause disease in the upcoming flu season. Even when the vaccine doesn't exactly match these viruses, it may still provide some protection. Influenza vaccine does not cause flu. Influenza vaccine may be given at the same time as other vaccines. 3. Talk with your health care provider Tell your vaccine provider if the person getting the vaccine:  Has had an allergic reaction after a previous dose of influenza vaccine, or has any severe, life-threatening allergies.  Has ever had Guillain-Barr Syndrome (also called GBS). In some cases, your  health care provider may decide to postpone influenza vaccination to a future visit. People with minor illnesses, such as a cold, may be vaccinated. People who are moderately or severely ill should usually wait until they recover before getting influenza vaccine. Your health care provider can give you more information. 4. Risks of a vaccine reaction  Soreness, redness, and swelling where shot is given, fever, muscle aches, and headache can happen after influenza vaccine.  There may be a very small increased risk of Guillain-Barr Syndrome (GBS) after inactivated influenza vaccine (the flu shot). Young children who get the flu shot along with pneumococcal vaccine (PCV13), and/or DTaP vaccine at the same time might be slightly more likely to have a seizure  caused by fever. Tell your health care provider if a child who is getting flu vaccine has ever had a seizure. People sometimes faint after medical procedures, including vaccination. Tell your provider if you feel dizzy or have vision changes or ringing in the ears. As with any medicine, there is a very remote chance of a vaccine causing a severe allergic reaction, other serious injury, or death. 5. What if there is a serious problem? An allergic reaction could occur after the vaccinated person leaves the clinic. If you see signs of a severe allergic reaction (hives, swelling of the face and throat, difficulty breathing, a fast heartbeat, dizziness, or weakness), call 9-1-1 and get the person to the nearest hospital. For other signs that concern you, call your health care provider. Adverse reactions should be reported to the Vaccine Adverse Event Reporting System (VAERS). Your health care provider will usually file this report, or you can do it yourself. Visit the VAERS website at www.vaers.SamedayNews.es or call 435-661-0119.VAERS is only for reporting reactions, and VAERS staff do not give medical advice. 6. The National Vaccine Injury Compensation Program The [employer] Vaccine Injury Compensation Program (VICP) is a federal program that was created to compensate people who may have been injured by certain vaccines. Visit the VICP website at GoldCloset.com.ee or call 719-344-2302 to learn about the program and about filing a claim. There is a time limit to file a claim for compensation. 7. How can I learn more?  Ask your healthcare provider.  Call your local or state health department.  Contact the Centers for Disease Control and Prevention (CDC): ? Call (310) 331-6040 (1-800-CDC-INFO) or ? Visit CDC's https://gibson.com/ Vaccine Information Statement (Interim) Inactivated Influenza Vaccine (07/05/2018) This information is not intended to replace advice given to you by your health care  provider. Make sure you discuss any questions you have with your health care provider. Document Revised: 02/26/2019 Document Reviewed: 07/09/2018 Elsevier Patient Education  2020 [employer].   [facility]  [place], [place], [place] [address]  Phone: [phone]   [facility] (second location)  [address] [place], [place] [address]  Phone: [phone]   [facility]) [place], [place],  [address] Phone: [phone]   [address] [place],  [address]  Phone: [phone]

## 2020-08-27 NOTE — Progress Notes (Signed)
ROB-Pt present for routine prenatal care and 36 week cultures. Pt stated having a lot of pressure and pain the vaginal area and braxton hick contractions.

## 2020-08-29 LAB — STREP GP B NAA: Strep Gp B NAA: POSITIVE — AB

## 2020-08-30 LAB — GC/CHLAMYDIA PROBE AMP
Chlamydia trachomatis, NAA: NEGATIVE
Neisseria Gonorrhoeae by PCR: NEGATIVE

## 2020-09-04 ENCOUNTER — Ambulatory Visit (INDEPENDENT_AMBULATORY_CARE_PROVIDER_SITE_OTHER): Payer: Medicaid Other | Admitting: Obstetrics and Gynecology

## 2020-09-04 ENCOUNTER — Encounter: Payer: Self-pay | Admitting: Obstetrics and Gynecology

## 2020-09-04 ENCOUNTER — Other Ambulatory Visit: Payer: Self-pay

## 2020-09-04 VITALS — BP 108/70 | HR 84 | Wt 188.1 lb

## 2020-09-04 DIAGNOSIS — O99820 Streptococcus B carrier state complicating pregnancy: Secondary | ICD-10-CM

## 2020-09-04 DIAGNOSIS — B009 Herpesviral infection, unspecified: Secondary | ICD-10-CM

## 2020-09-04 DIAGNOSIS — Z3A37 37 weeks gestation of pregnancy: Secondary | ICD-10-CM

## 2020-09-04 DIAGNOSIS — Z3483 Encounter for supervision of other normal pregnancy, third trimester: Secondary | ICD-10-CM

## 2020-09-04 LAB — POCT URINALYSIS DIPSTICK OB
Bilirubin, UA: NEGATIVE
Blood, UA: NEGATIVE
Glucose, UA: NEGATIVE
Nitrite, UA: NEGATIVE
POC,PROTEIN,UA: NEGATIVE
Spec Grav, UA: 1.01 (ref 1.010–1.025)
Urobilinogen, UA: 0.2 E.U./dL
pH, UA: 7 (ref 5.0–8.0)

## 2020-09-04 NOTE — Progress Notes (Signed)
ROB: Patient complains of lower abdominal pain and pressure.  Reports intermittent ctx. GBS positive, will need antibiotics. Discussed labor precautions. Continue HSV suppression. Is thinking of taking last week of during pregnancy for her job, if she does, will need work notice. Further discussed elective IOL at 39 weeks for social reasons (partner is truck driver, sometimes has to drive as far as [place] or [place] and is gone for days). Scheduled for 10/29. RTC in 1 week.

## 2020-09-04 NOTE — Progress Notes (Signed)
ROB-Pt present for routine prenatal care. Pt c/o of lower abd pain and pressure.

## 2020-09-04 NOTE — Patient Instructions (Addendum)
COVID 19 Instructions for Scheduled Procedure (Inductions/C-sections and GYN surgeries)   Thank you for choosing [facility] for your services.  You have been scheduled for a procedure called ________Induction of Labor_________________.    Your procedure is scheduled on ____________10/29/2021______________________.  You are required to have COVID-19 testing performed 2 days prior to your scheduled procedure date.  Testing is performed between 9 AM and 1 PM Monday through Friday.  Please present for testing on ____10/27/2021____________________ during this hour. Drive up testing is performed in front of the [facility] (this is next to the [employer]).    Upon your scheduled procedure date, you will need to arrive at the [facility] entrance. (There is a statue at the front of this entrance.)   Please arrive on time if you are scheduled for an induction of labor.   If you are scheduled for a Cesarean delivery or for Gyn Surgery, arrive 2 hours prior to your procedure time.   If you are an Obstetric patient and your arrival time falls between 11 PM and 6 AM call L&D [phone]) when you arrive.  A staff member will meet you at the [facility] entrance.  At this time, patients are allowed 1 support person to accompany them. Face masks are required for you and your support person. Your support person is now allowed to be there with you during the entire time of your admission.   Please contact the office if you have any questions regarding this information.  The [facility] office number is [phone].     Thank you,    Your [facility] Providers      Labor Induction  Labor induction is when steps are taken to cause a pregnant woman to begin the labor process. Most women go into labor on their own between 37 weeks and 42 weeks of pregnancy. When this does not happen or when there is a medical need for labor to begin, steps may be taken to induce labor.  Labor induction causes a pregnant woman's uterus to contract. It also causes the cervix to soften (ripen), open (dilate), and thin out (efface). Usually, labor is not induced before 39 weeks of pregnancy unless there is a medical reason to do so. Your health care provider will determine if labor induction is needed. Before inducing labor, your health care provider will consider a number of factors, including:  Your medical condition and your baby's.  How many weeks along you are in your pregnancy.  How mature your baby's lungs are.  The condition of your cervix.  The position of your baby.  The size of your birth canal. What are some reasons for labor induction? Labor may be induced if:  Your health or your baby's health is at risk.  Your pregnancy is overdue by 1 week or more.  Your water breaks but labor does not start on its own.  There is a low amount of amniotic fluid around your baby. You may also choose (elect) to have labor induced at a certain time. Generally, elective labor induction is done no earlier than 39 weeks of pregnancy. What methods are used for labor induction? Methods used for labor induction include:  Prostaglandin medicine. This medicine starts contractions and causes the cervix to dilate and ripen. It can be taken by mouth (orally) or by being inserted into the vagina (suppository).  Inserting a small, thin tube (catheter) with a balloon into the vagina and then expanding the balloon  with water to dilate the cervix.  Stripping the membranes. In this method, your health care provider gently separates amniotic sac tissue from the cervix. This causes the cervix to stretch, which in turn causes the release of a hormone called progesterone. The hormone causes the uterus to contract. This procedure is often done during an office visit, after which you will be sent home to wait for contractions to begin.  Breaking the water. In this method, your health care  provider uses a small instrument to make a small hole in the amniotic sac. This eventually causes the amniotic sac to break. Contractions should begin after a few hours.  Medicine to trigger or strengthen contractions. This medicine is given through an IV that is inserted into a vein in your arm. Except for membrane stripping, which can be done in a clinic, labor induction is done in the hospital so that you and your baby can be carefully monitored. How long does it take for labor to be induced? The length of time it takes to induce labor depends on how ready your body is for labor. Some inductions can take up to 2-3 days, while others may take less than a day. Induction may take longer if:  You are induced early in your pregnancy.  It is your first pregnancy.  Your cervix is not ready. What are some risks associated with labor induction? Some risks associated with labor induction include:  Changes in fetal heart rate, such as being too high, too low, or irregular (erratic).  Failed induction.  Infection in the mother or the baby.  Increased risk of having a cesarean delivery.  Fetal death.  Breaking off (abruption) of the placenta from the uterus (rare).  Rupture of the uterus (very rare). When induction is needed for medical reasons, the benefits of induction generally outweigh the risks. What are some reasons for not inducing labor? Labor induction should not be done if:  Your baby does not tolerate contractions.  You have had previous surgeries on your uterus, such as a myomectomy, removal of fibroids, or a vertical scar from a previous cesarean delivery.  Your placenta lies very low in your uterus and blocks the opening of the cervix (placenta previa).  Your baby is not in a head-down position.  The umbilical cord drops down into the birth canal in front of the baby.  There are unusual circumstances, such as the baby being very early (premature).  You have had more  than 2 previous cesarean deliveries. Summary  Labor induction is when steps are taken to cause a pregnant woman to begin the labor process.  Labor induction causes a pregnant woman's uterus to contract. It also causes the cervix to ripen, dilate, and efface.  Labor is not induced before 39 weeks of pregnancy unless there is a medical reason to do so.  When induction is needed for medical reasons, the benefits of induction generally outweigh the risks. This information is not intended to replace advice given to you by your health care provider. Make sure you discuss any questions you have with your health care provider. Document Revised: 11/10/2017 Document Reviewed: 12/21/2016 Elsevier Patient Education  2020 [employer].

## 2020-09-07 ENCOUNTER — Telehealth: Payer: Self-pay

## 2020-09-07 NOTE — Telephone Encounter (Signed)
Pt called in and stated that she needs a note taking her out of work that she is [redacted] weeks pregnant. I asked her was she meaning her FMLA and she said that she hasn't been with the company long enough. I told the pt that I will send a message. I asked the pt is a note just saying that you need to be out okay. The pt said that she gets 6 weeks out. I told the pt that sounds like FMLA and we have a process her that their is a $25 form fee. The pt verbally understood that I will send a message and someone will be in touch. Please advise

## 2020-09-08 ENCOUNTER — Other Ambulatory Visit: Payer: Self-pay

## 2020-09-08 ENCOUNTER — Observation Stay
Admission: EM | Admit: 2020-09-08 | Discharge: 2020-09-08 | Disposition: A | Discharge status: Home / Self Care | Payer: Medicaid Other | Attending: Obstetrics and Gynecology | Admitting: Obstetrics and Gynecology

## 2020-09-08 ENCOUNTER — Encounter: Payer: Self-pay | Admitting: Obstetrics and Gynecology

## 2020-09-08 DIAGNOSIS — Z3A38 38 weeks gestation of pregnancy: Secondary | ICD-10-CM | POA: Insufficient documentation

## 2020-09-08 DIAGNOSIS — R1084 Generalized abdominal pain: Secondary | ICD-10-CM

## 2020-09-08 DIAGNOSIS — O26893 Other specified pregnancy related conditions, third trimester: Principal | ICD-10-CM | POA: Insufficient documentation

## 2020-09-08 HISTORY — DX: Anemia, unspecified: D64.9

## 2020-09-08 NOTE — Progress Notes (Signed)
Pt given discharge paper work including to follow up with scheduled appt on Friday. Pt scheduled for induction next week. Pt given labor precautions. Pt educated on staying hydrated. Pt verbalized understanding and had no questions.

## 2020-09-08 NOTE — Discharge Summary (Signed)
° ° °  L&D OB Triage Note  SUBJECTIVE Wanda Richmond is a 29 y.o. G3P1011 female at [redacted]w[redacted]d, EDD Estimated Date of Delivery: 09/21/20 who presented to triage with complaints of pelvic cramping.  She denies vaginal bleeding.  Reports fetal movement.  Denies leakage of fluid.  OB History  Gravida Para Term Preterm AB Living  3 1 1  0 1 1  SAB TAB Ectopic Multiple Live Births  1 0 0 0 1    # Outcome Date GA Lbr Len/2nd Weight Sex Delivery Anes PTL Lv  3 Current           2 SAB 2015          1 Term 2013 [redacted]w[redacted]d  3685 g F Vag-Spont  Y LIV    No medications prior to admission.     OBJECTIVE  Nursing Evaluation:   BP 106/72    Pulse (!) 103    Temp 98 F (36.7 C) (Oral)    Resp 18    Ht 5' 6.5" (1.689 m)    Wt 85.7 kg    LMP 12/16/2019    BMI 30.05 kg/m    Findings:        No evidence of labor-rare contractions -cervix unchanged from previous exam     Reactive NST      NST was performed and has been reviewed by me.  NST INTERPRETATION: Category I  Mode: External Baseline Rate (A): 145 bpm Variability: Moderate Accelerations: 15 x 15 Decelerations: Variable     Contraction Frequency (min): rare  ASSESSMENT Impression:  1.  Pregnancy:  G3P1011 at [redacted]w[redacted]d , EDD Estimated Date of Delivery: 09/21/20 2.  Reassuring fetal and maternal status   PLAN 1. Discussed current condition and above findings with patient and reassurance given.  All questions answered. 2. Discharge home with standard labor precautions given to return to L&D or call the office for problems. 3. Continue routine prenatal care.

## 2020-09-08 NOTE — Progress Notes (Signed)
Pt reports for lower back pain/ abd pain that started last night that resolved with hydration. Pt reports pain started again around 6am and occurs about every 30 min. Pt has not done any interventions this morning.  +FM. Denies LOF. VSS. Will continue to monitor

## 2020-09-09 NOTE — Telephone Encounter (Signed)
Spoke with patient and she stated that she doesn't need any paperwork filled out at this time. Patient stated that she went out of work as of yesterday. Patient stated that she emailed her supervisor and the email is all that they need at this time. I let the patient know that if they decide they need paperwork filled out at any time we are happy to do so for FMLA. Patient will let us know if anything else is needed.

## 2020-09-11 ENCOUNTER — Encounter: Payer: Medicaid Other | Admitting: Obstetrics and Gynecology

## 2020-09-15 ENCOUNTER — Other Ambulatory Visit: Payer: Self-pay

## 2020-09-15 ENCOUNTER — Encounter: Payer: Self-pay | Admitting: Obstetrics and Gynecology

## 2020-09-15 ENCOUNTER — Ambulatory Visit (INDEPENDENT_AMBULATORY_CARE_PROVIDER_SITE_OTHER): Payer: Medicaid Other | Admitting: Obstetrics and Gynecology

## 2020-09-15 VITALS — BP 108/69 | HR 93 | Wt 195.6 lb

## 2020-09-15 DIAGNOSIS — Z3483 Encounter for supervision of other normal pregnancy, third trimester: Secondary | ICD-10-CM

## 2020-09-15 DIAGNOSIS — Z3A39 39 weeks gestation of pregnancy: Secondary | ICD-10-CM

## 2020-09-15 LAB — POCT URINALYSIS DIPSTICK OB
Bilirubin, UA: NEGATIVE
Blood, UA: NEGATIVE
Glucose, UA: NEGATIVE
Ketones, UA: NEGATIVE
Leukocytes, UA: NEGATIVE
Nitrite, UA: NEGATIVE
POC,PROTEIN,UA: NEGATIVE
Spec Grav, UA: 1.015 (ref 1.010–1.025)
Urobilinogen, UA: 0.2 E.U./dL
pH, UA: 7 (ref 5.0–8.0)

## 2020-09-15 NOTE — Progress Notes (Signed)
ROB: Doing well.  Occasional irregular contractions.  Induction scheduled for Friday.  Covid test tomorrow as directed.

## 2020-09-16 ENCOUNTER — Other Ambulatory Visit
Admission: RE | Admit: 2020-09-16 | Discharge: 2020-09-16 | Disposition: A | Discharge status: Home / Self Care | Payer: Medicaid Other | Source: Ambulatory Visit | Attending: Obstetrics and Gynecology | Admitting: Obstetrics and Gynecology

## 2020-09-16 DIAGNOSIS — Z20822 Contact with and (suspected) exposure to covid-19: Secondary | ICD-10-CM | POA: Insufficient documentation

## 2020-09-16 DIAGNOSIS — Z01812 Encounter for preprocedural laboratory examination: Secondary | ICD-10-CM | POA: Insufficient documentation

## 2020-09-16 LAB — SARS CORONAVIRUS 2 (TAT 6-24 HRS): SARS Coronavirus 2: NEGATIVE

## 2020-09-18 ENCOUNTER — Inpatient Hospital Stay: Payer: Medicaid Other | Admitting: Anesthesiology

## 2020-09-18 ENCOUNTER — Other Ambulatory Visit: Payer: Self-pay

## 2020-09-18 ENCOUNTER — Encounter: Payer: Self-pay | Admitting: Obstetrics and Gynecology

## 2020-09-18 ENCOUNTER — Inpatient Hospital Stay
Admission: EM | Admit: 2020-09-18 | Discharge: 2020-09-19 | DRG: 806 | Disposition: A | Discharge status: Home / Self Care | Payer: Medicaid Other | Attending: Obstetrics and Gynecology | Admitting: Obstetrics and Gynecology

## 2020-09-18 DIAGNOSIS — Z8619 Personal history of other infectious and parasitic diseases: Secondary | ICD-10-CM

## 2020-09-18 DIAGNOSIS — O99824 Streptococcus B carrier state complicating childbirth: Secondary | ICD-10-CM | POA: Diagnosis not present

## 2020-09-18 DIAGNOSIS — O99820 Streptococcus B carrier state complicating pregnancy: Secondary | ICD-10-CM

## 2020-09-18 DIAGNOSIS — Z880 Allergy status to penicillin: Secondary | ICD-10-CM

## 2020-09-18 DIAGNOSIS — O26893 Other specified pregnancy related conditions, third trimester: Secondary | ICD-10-CM | POA: Diagnosis not present

## 2020-09-18 DIAGNOSIS — A6000 Herpesviral infection of urogenital system, unspecified: Secondary | ICD-10-CM | POA: Diagnosis not present

## 2020-09-18 DIAGNOSIS — Z23 Encounter for immunization: Secondary | ICD-10-CM

## 2020-09-18 DIAGNOSIS — O9902 Anemia complicating childbirth: Secondary | ICD-10-CM | POA: Diagnosis present

## 2020-09-18 DIAGNOSIS — O9832 Other infections with a predominantly sexual mode of transmission complicating childbirth: Secondary | ICD-10-CM | POA: Diagnosis present

## 2020-09-18 DIAGNOSIS — Z3490 Encounter for supervision of normal pregnancy, unspecified, unspecified trimester: Secondary | ICD-10-CM | POA: Diagnosis present

## 2020-09-18 DIAGNOSIS — D649 Anemia, unspecified: Secondary | ICD-10-CM | POA: Diagnosis not present

## 2020-09-18 DIAGNOSIS — Z3A39 39 weeks gestation of pregnancy: Secondary | ICD-10-CM

## 2020-09-18 HISTORY — DX: Personal history of other infectious and parasitic diseases: Z86.19

## 2020-09-18 LAB — TYPE AND SCREEN
ABO/RH(D): O POS
Antibody Screen: NEGATIVE

## 2020-09-18 LAB — ABO/RH: ABO/RH(D): O POS

## 2020-09-18 LAB — CBC
HCT: 33.3 % — ABNORMAL LOW (ref 36.0–46.0)
Hemoglobin: 11.1 g/dL — ABNORMAL LOW (ref 12.0–15.0)
MCH: 32.8 pg (ref 26.0–34.0)
MCHC: 33.3 g/dL (ref 30.0–36.0)
MCV: 98.5 fL (ref 80.0–100.0)
Platelets: 238 10*3/uL (ref 150–400)
RBC: 3.38 MIL/uL — ABNORMAL LOW (ref 3.87–5.11)
RDW: 13.4 % (ref 11.5–15.5)
WBC: 6.8 10*3/uL (ref 4.0–10.5)
nRBC: 0 % (ref 0.0–0.2)

## 2020-09-18 LAB — RPR: RPR Ser Ql: NONREACTIVE

## 2020-09-18 MED ORDER — ONDANSETRON HCL 4 MG PO TABS: 4.0000 mg | ORAL_TABLET | ORAL | Status: DC | PRN

## 2020-09-18 MED ORDER — OXYCODONE-ACETAMINOPHEN 5-325 MG PO TABS: 1.0000 | ORAL_TABLET | ORAL | Status: DC | PRN

## 2020-09-18 MED ORDER — LACTATED RINGERS IV BOLUS
1000.0000 mL | Freq: Once | INTRAVENOUS | Status: AC
  Administered 2020-09-18: 1000 mL via INTRAVENOUS

## 2020-09-18 MED ORDER — OXYTOCIN-SODIUM CHLORIDE 30-0.9 UT/500ML-% IV SOLN
2.5000 [IU]/h | INTRAVENOUS | Status: DC
  Filled 2020-09-18: qty 500

## 2020-09-18 MED ORDER — PHENYLEPHRINE 40 MCG/ML (10ML) SYRINGE FOR IV PUSH (FOR BLOOD PRESSURE SUPPORT)
PREFILLED_SYRINGE | INTRAVENOUS | Status: DC | PRN
  Administered 2020-09-18: 40 ug via INTRAVENOUS

## 2020-09-18 MED ORDER — TERBUTALINE SULFATE 1 MG/ML IJ SOLN: 0.2500 mg | Freq: Once | INTRAMUSCULAR | Status: DC | PRN

## 2020-09-18 MED ORDER — AMMONIA AROMATIC IN INHA
RESPIRATORY_TRACT | Status: AC
  Filled 2020-09-18: qty 10

## 2020-09-18 MED ORDER — SODIUM CHLORIDE 0.9 % IV SOLN: 1.0000 g | INTRAVENOUS | Status: DC

## 2020-09-18 MED ORDER — EPHEDRINE SULFATE-NACL 50-0.9 MG/10ML-% IV SOSY
PREFILLED_SYRINGE | INTRAVENOUS | Status: DC | PRN
  Administered 2020-09-18: 10 mg via INTRAVENOUS

## 2020-09-18 MED ORDER — BUTORPHANOL TARTRATE 1 MG/ML IJ SOLN: 1.0000 mg | INTRAMUSCULAR | Status: DC | PRN

## 2020-09-18 MED ORDER — LACTATED RINGERS IV SOLN
125.0000 mL/h | INTRAVENOUS | Status: DC
  Administered 2020-09-18 (×2): 125 mL/h via INTRAVENOUS

## 2020-09-18 MED ORDER — LIDOCAINE HCL (PF) 1 % IJ SOLN
INTRAMUSCULAR | Status: DC | PRN
  Administered 2020-09-18: 3 mL

## 2020-09-18 MED ORDER — LACTATED RINGERS IV SOLN
500.0000 mL | INTRAVENOUS | Status: DC | PRN
  Administered 2020-09-18: 500 mL via INTRAVENOUS
  Administered 2020-09-18: 1000 mL via INTRAVENOUS

## 2020-09-18 MED ORDER — SODIUM CHLORIDE 0.9 % IV SOLN
2.0000 g | Freq: Once | INTRAVENOUS | Status: AC
  Administered 2020-09-18: 2 g via INTRAVENOUS
  Filled 2020-09-18: qty 2000

## 2020-09-18 MED ORDER — LIDOCAINE HCL (PF) 1 % IJ SOLN: 30.0000 mL | INTRAMUSCULAR | Status: DC | PRN

## 2020-09-18 MED ORDER — DIPHENHYDRAMINE HCL 25 MG PO CAPS: 25.0000 mg | ORAL_CAPSULE | Freq: Four times a day (QID) | ORAL | Status: DC | PRN

## 2020-09-18 MED ORDER — BUPIVACAINE HCL (PF) 0.25 % IJ SOLN
INTRAMUSCULAR | Status: DC | PRN
  Administered 2020-09-18: 2 mL via EPIDURAL
  Administered 2020-09-18: 4 mL via EPIDURAL

## 2020-09-18 MED ORDER — MISOPROSTOL 25 MCG QUARTER TABLET
50.0000 ug | ORAL_TABLET | ORAL | Status: DC | PRN
  Filled 2020-09-18: qty 2
  Filled 2020-09-18: qty 1

## 2020-09-18 MED ORDER — LACTATED RINGERS IV SOLN
500.0000 mL | Freq: Once | INTRAVENOUS | Status: AC
  Administered 2020-09-18: 500 mL via INTRAVENOUS

## 2020-09-18 MED ORDER — ONDANSETRON HCL 4 MG/2ML IJ SOLN: 4.0000 mg | Freq: Four times a day (QID) | INTRAMUSCULAR | Status: DC | PRN

## 2020-09-18 MED ORDER — PRENATAL MULTIVITAMIN CH
1.0000 | ORAL_TABLET | Freq: Every day | ORAL | Status: DC
  Administered 2020-09-18 – 2020-09-19 (×2): 1 via ORAL
  Filled 2020-09-18 (×2): qty 1

## 2020-09-18 MED ORDER — IBUPROFEN 600 MG PO TABS
600.0000 mg | ORAL_TABLET | Freq: Four times a day (QID) | ORAL | Status: DC
  Administered 2020-09-18 – 2020-09-19 (×5): 600 mg via ORAL
  Filled 2020-09-18 (×5): qty 1

## 2020-09-18 MED ORDER — SIMETHICONE 80 MG PO CHEW: 80.0000 mg | CHEWABLE_TABLET | ORAL | Status: DC | PRN

## 2020-09-18 MED ORDER — FENTANYL 2.5 MCG/ML W/ROPIVACAINE 0.15% IN NS 100 ML EPIDURAL (ARMC)
12.0000 mL/h | EPIDURAL | Status: DC
  Administered 2020-09-18: 12 mL/h via EPIDURAL

## 2020-09-18 MED ORDER — OXYTOCIN 10 UNIT/ML IJ SOLN
INTRAMUSCULAR | Status: AC
  Filled 2020-09-18: qty 2

## 2020-09-18 MED ORDER — LIDOCAINE HCL (PF) 1 % IJ SOLN
INTRAMUSCULAR | Status: AC
  Filled 2020-09-18: qty 30

## 2020-09-18 MED ORDER — SENNOSIDES-DOCUSATE SODIUM 8.6-50 MG PO TABS
2.0000 | ORAL_TABLET | ORAL | Status: DC
  Administered 2020-09-19: 2 via ORAL
  Filled 2020-09-18: qty 2

## 2020-09-18 MED ORDER — DIPHENHYDRAMINE HCL 50 MG/ML IJ SOLN: 12.5000 mg | INTRAMUSCULAR | Status: DC | PRN

## 2020-09-18 MED ORDER — ONDANSETRON HCL 4 MG/2ML IJ SOLN: 4.0000 mg | INTRAMUSCULAR | Status: DC | PRN

## 2020-09-18 MED ORDER — FENTANYL 2.5 MCG/ML W/ROPIVACAINE 0.15% IN NS 100 ML EPIDURAL (ARMC)
EPIDURAL | Status: AC
  Filled 2020-09-18: qty 100

## 2020-09-18 MED ORDER — DIBUCAINE (PERIANAL) 1 % EX OINT: 1.0000 "application " | TOPICAL_OINTMENT | CUTANEOUS | Status: DC | PRN

## 2020-09-18 MED ORDER — CLINDAMYCIN PHOSPHATE 900 MG/50ML IV SOLN
900.0000 mg | Freq: Three times a day (TID) | INTRAVENOUS | Status: DC
  Administered 2020-09-18: 900 mg via INTRAVENOUS
  Filled 2020-09-18 (×3): qty 50

## 2020-09-18 MED ORDER — MISOPROSTOL 200 MCG PO TABS
ORAL_TABLET | ORAL | Status: AC
  Administered 2020-09-18: 50 ug via VAGINAL
  Filled 2020-09-18: qty 4

## 2020-09-18 MED ORDER — PHENYLEPHRINE 40 MCG/ML (10ML) SYRINGE FOR IV PUSH (FOR BLOOD PRESSURE SUPPORT)
80.0000 ug | PREFILLED_SYRINGE | INTRAVENOUS | Status: DC | PRN
  Filled 2020-09-18 (×2): qty 10

## 2020-09-18 MED ORDER — ACETAMINOPHEN 325 MG PO TABS: 650.0000 mg | ORAL_TABLET | ORAL | Status: DC | PRN

## 2020-09-18 MED ORDER — WITCH HAZEL-GLYCERIN EX PADS: 1.0000 "application " | MEDICATED_PAD | CUTANEOUS | Status: DC | PRN

## 2020-09-18 MED ORDER — LIDOCAINE-EPINEPHRINE (PF) 1.5 %-1:200000 IJ SOLN
INTRAMUSCULAR | Status: DC | PRN
  Administered 2020-09-18: 3 mL via PERINEURAL

## 2020-09-18 MED ORDER — COCONUT OIL OIL
1.0000 "application " | TOPICAL_OIL | Status: DC | PRN
  Administered 2020-09-18: 1 via TOPICAL
  Filled 2020-09-18: qty 120

## 2020-09-18 MED ORDER — SOD CITRATE-CITRIC ACID 500-334 MG/5ML PO SOLN: 30.0000 mL | ORAL | Status: DC | PRN

## 2020-09-18 MED ORDER — EPHEDRINE 5 MG/ML INJ
10.0000 mg | INTRAVENOUS | Status: DC | PRN
  Filled 2020-09-18: qty 2

## 2020-09-18 MED ORDER — BENZOCAINE-MENTHOL 20-0.5 % EX AERO: 1.0000 "application " | INHALATION_SPRAY | CUTANEOUS | Status: DC | PRN

## 2020-09-18 MED ORDER — PHENYLEPHRINE 40 MCG/ML (10ML) SYRINGE FOR IV PUSH (FOR BLOOD PRESSURE SUPPORT)
80.0000 ug | PREFILLED_SYRINGE | INTRAVENOUS | Status: DC | PRN
  Filled 2020-09-18: qty 10

## 2020-09-18 MED ORDER — ZOLPIDEM TARTRATE 5 MG PO TABS: 5.0000 mg | ORAL_TABLET | Freq: Every evening | ORAL | Status: DC | PRN

## 2020-09-18 MED ORDER — OXYCODONE-ACETAMINOPHEN 5-325 MG PO TABS: 2.0000 | ORAL_TABLET | ORAL | Status: DC | PRN

## 2020-09-18 MED ORDER — ACETAMINOPHEN 325 MG PO TABS
650.0000 mg | ORAL_TABLET | ORAL | Status: DC | PRN
  Administered 2020-09-18 – 2020-09-19 (×2): 650 mg via ORAL
  Filled 2020-09-18 (×2): qty 2

## 2020-09-18 MED ORDER — OXYTOCIN BOLUS FROM INFUSION
333.0000 mL | Freq: Once | INTRAVENOUS | Status: AC
  Administered 2020-09-18: 333 mL via INTRAVENOUS

## 2020-09-18 NOTE — Anesthesia Preprocedure Evaluation (Signed)
Anesthesia Evaluation  Patient identified by MRN, date of birth, ID band Patient awake    Reviewed: Allergy & Precautions, H&P , NPO status , Patient's Chart, lab work & pertinent test results  Airway Mallampati: II  TM Distance: >3 FB     Dental  (+) Dental Advidsory Given   Pulmonary neg pulmonary ROS,           Cardiovascular negative cardio ROS       Neuro/Psych negative neurological ROS  negative psych ROS   GI/Hepatic Neg liver ROS, GERD  ,  Endo/Other  negative endocrine ROS  Renal/GU negative Renal ROS  negative genitourinary   Musculoskeletal   Abdominal   Peds  Hematology  (+) Blood dyscrasia, anemia ,   Anesthesia Other Findings   Reproductive/Obstetrics (+) Pregnancy                             Anesthesia Physical Anesthesia Plan  ASA: II  Anesthesia Plan:    Post-op Pain Management:    Induction:   PONV Risk Score and Plan:   Airway Management Planned:   Additional Equipment:   Intra-op Plan:   Post-operative Plan:   Informed Consent: I have reviewed the patients History and Physical, chart, labs and discussed the procedure including the risks, benefits and alternatives for the proposed anesthesia with the patient or authorized representative who has indicated his/her understanding and acceptance.       Plan Discussed with: Anesthesiologist and CRNA  Anesthesia Plan Comments:         Anesthesia Quick Evaluation

## 2020-09-18 NOTE — H&P (Signed)
Obstetric History and Physical  Media Wanda Richmond is a 29 y.o. G3P1011 with IUP at [redacted]w[redacted]d presenting for scheduled elective IOL (social induction, partner is a Naval architect). Patient states she has been having  irregular contractions, no vaginal bleedingmembranes, with active fetal movement.  Patient states that she thinks she felt something "pop".   Prenatal Course Source of Care: [facility] with onset of care at 7 weeks Pregnancy complications or risks: Patient Active Problem List   Diagnosis Date Noted  . Encounter for elective induction of labor 09/18/2020  . Indication for care or intervention related to labor and delivery 09/08/2020  . GBS (group B Streptococcus carrier), +RV culture, currently pregnant 09/04/2020  . UTI in pregnancy, antepartum 04/07/2020   She plans to breastfeed She desires unknown for postpartum contraception.   Prenatal labs and studies: ABO, Rh: --/--/O POS Performed at [facility], [address] [place], [place] [address]  [phone]) Antibody: NEG (10/29 0052) Rubella: <0.90 (03/15 1131) RPR: Non Reactive (08/11 0910)  HBsAg: Negative (03/15 1131)  HIV: Non Reactive (03/15 1131)  FVC:BSWHQPRF/-- (10/07 1201) 1 hr Glucola  Normal Genetic screening normal Anatomy US normal    Past Medical History:  Diagnosis Date  . Anemia   . No pertinent past medical history     Past Surgical History:  Procedure Laterality Date  . WISDOM TOOTH EXTRACTION      OB History  Gravida Para Term Preterm AB Living  3 1 1   1 1   SAB TAB Ectopic Multiple Live Births  1       1    # Outcome Date GA Lbr Len/2nd Weight Sex Delivery Anes PTL Lv  3 Current           2 SAB 2015          1 Term 2013 [redacted]w[redacted]d  3685 g F Vag-Spont  Y LIV    Social History   Socioeconomic History  . Marital status: Married    Spouse name: M  . Number of children: Not on file  . Years of education: Not on file  . Highest education level: Not on file   Occupational History  . Not on file  Tobacco Use  . Smoking status: Never Smoker  . Smokeless tobacco: Never Used  Vaping Use  . Vaping Use: Never used  Substance and Sexual Activity  . Alcohol use: Never  . Drug use: Never  . Sexual activity: Yes  Other Topics Concern  . Not on file  Social History Narrative  . Not on file   Social Determinants of Health   Financial Resource Strain:   . Difficulty of Paying Living Expenses: Not on file  Food Insecurity:   . Worried About Gregary Signs in the Last Year: Not on file  . Ran Out of Food in the Last Year: Not on file  Transportation Needs:   . Lack of Transportation (Medical): Not on file  . Lack of Transportation (Non-Medical): Not on file  Physical Activity:   . Days of Exercise per Week: Not on file  . Minutes of Exercise per Session: Not on file  Stress:   . Feeling of Stress : Not on file  Social Connections:   . Frequency of Communication with Friends and Family: Not on file  . Frequency of Social Gatherings with Friends and Family: Not on file  . Attends Religious Services: Not on file  . Active Member of Clubs or Organizations: Not  on file  . Attends Banker Meetings: Not on file  . Marital Status: Not on file    Family History  Problem Relation Age of Onset  . Healthy Mother   . Healthy Father   . Breast cancer Paternal Grandmother     Medications Prior to Admission  Medication Sig Dispense Refill Last Dose  . ferrous sulfate 325 (65 FE) MG tablet Take 325 mg by mouth daily with breakfast.   09/17/2020 at Unknown time  . Prenatal Vit-Fe Fumarate-FA (MULTIVITAMIN-PRENATAL) 27-0.8 MG TABS tablet Take 1 tablet by mouth daily at 12 noon.   09/17/2020 at Unknown time  . valACYclovir (VALTREX) 500 MG tablet Take 1 tablet (500 mg total) by mouth 2 (two) times daily. 60 tablet 0 09/17/2020 at Unknown time    Allergies  Allergen Reactions  . Penicillins Shortness Of Breath and Cough     Review of Systems: Negative except for what is mentioned in HPI.  Physical Exam: BP (!) 101/56 (BP Location: Right Arm)   Pulse 61   Temp 98.2 F (36.8 C) (Oral)   Resp 16   Ht 5\' 6"  (1.676 m)   Wt 88.5 kg   LMP 12/16/2019   SpO2 99%   BMI 31.47 kg/m  CONSTITUTIONAL: Well-developed, well-nourished female in no acute distress.  HENT:  Normocephalic, atraumatic, External right and left ear normal. Oropharynx is clear and moist EYES: Conjunctivae and EOM are normal. Pupils are equal, round, and reactive to light. No scleral icterus.  NECK: Normal range of motion, supple, no masses SKIN: Skin is warm and dry. No rash noted. Not diaphoretic. No erythema. No pallor. NEUROLOGIC: Alert and oriented to person, place, and time. Normal reflexes, muscle tone coordination. No cranial nerve deficit noted. PSYCHIATRIC: Normal mood and affect. Normal behavior. Normal judgment and thought content. CARDIOVASCULAR: Normal heart rate noted, regular rhythm RESPIRATORY: Effort and breath sounds normal, no problems with respiration noted ABDOMEN: Soft, nontender, nondistended, gravid. MUSCULOSKELETAL: Normal range of motion. No edema and no tenderness. 2+ distal pulses.  Cervical Exam: Dilatation 2.5-3cm   Effacement 30-40%   Station -2   Presentation: cephalic FHT:  Baseline rate 135 bpm   Variability moderate  Accelerations present   Decelerations none Contractions: Irregular, q 1-3 mins   Pertinent Labs/Studies:   Results for orders placed or performed during the hospital encounter of 09/18/20 (from the past 24 hour(s))  CBC     Status: Abnormal   Collection Time: 09/18/20 12:52 AM  Result Value Ref Range   WBC 6.8 4.0 - 10.5 K/uL   RBC 3.38 (L) 3.87 - 5.11 MIL/uL   Hemoglobin 11.1 (L) 12.0 - 15.0 g/dL   HCT 09/20/20 (L) 36 - 46 %   MCV 98.5 80.0 - 100.0 fL   MCH 32.8 26.0 - 34.0 pg   MCHC 33.3 30.0 - 36.0 g/dL   RDW 52.7 78.2 - 42.3 %   Platelets 238 150 - 400 K/uL   nRBC 0.0 0.0 - 0.2 %   Type and screen     Status: None   Collection Time: 09/18/20 12:52 AM  Result Value Ref Range   ABO/RH(D) O POS    Antibody Screen NEG    Sample Expiration      09/21/2020,2359 Performed at [facility], [address], [place], [place]   ABO/Rh     Status: None   Collection Time: 09/18/20  2:20 AM  Result Value Ref Range   ABO/RH(D)  O POS Performed at [facility], [address] [place], [place] [address]     Assessment : Wanda Richmond is a 29 y.o. G3P1011 at [redacted]w[redacted]d being admitted for social induction of labor.  H/o HSV II, on suppressive therapy. GBS+, PCN allergy  Plan: Labor: Induction/Augmentation as ordered as per protocol. Received 1 dose of Cytotec overnight. Analgesia as needed. SROM'd clear fluid.  FWB: Reassuring fetal heart tracing.  GBS positive. Changed from PCN to Clindamycin as patient had allergic reaction to first dose.  Delivery plan: Hopeful for vaginal delivery  Hildred Laser, MD [facility]

## 2020-09-18 NOTE — Anesthesia Procedure Notes (Signed)
Epidural Patient location during procedure: OB Start time: 09/18/2020 8:46 AM End time: 09/18/2020 8:59 AM  Staffing Anesthesiologist: Lenard Simmer, MD Resident/CRNA: Elmarie Mainland, CRNA Performed: resident/CRNA   Preanesthetic Checklist Completed: patient identified, IV checked, site marked, risks and benefits discussed, surgical consent, monitors and equipment checked, pre-op evaluation and timeout performed  Epidural Patient position: sitting Prep: ChloraPrep Patient monitoring: heart rate, continuous pulse ox and blood pressure Approach: midline Location: L3-L4 Injection technique: LOR saline  Needle:  Needle type: Tuohy  Needle gauge: 17 G Needle length: 9 cm and 9 Needle insertion depth: 6.5 cm Catheter type: closed end flexible Catheter size: 19 Gauge Catheter at skin depth: 11 cm Test dose: negative and 1.5% lidocaine with Epi 1:200 K  Assessment Sensory level: T10 Events: blood not aspirated, injection not painful, no injection resistance, no paresthesia and negative IV test  Additional Notes 2 attempt Pt. Evaluated and documentation done after procedure finished. Patient identified. Risks/Benefits/Options discussed with patient including but not limited to bleeding, infection, nerve damage, paralysis, failed block, incomplete pain control, headache, blood pressure changes, nausea, vomiting, reactions to medication both or allergic, itching and postpartum back pain. Confirmed with bedside nurse the patient's most recent platelet count. Confirmed with patient that they are not currently taking any anticoagulation, have any bleeding history or any family history of bleeding disorders. Patient expressed understanding and wished to proceed. All questions were answered. Sterile technique was used throughout the entire procedure. Please see nursing notes for vital signs. Test dose was given through epidural catheter and negative prior to continuing to dose epidural or  start infusion. Warning signs of high block given to the patient including shortness of breath, tingling/numbness in hands, complete motor block, or any concerning symptoms with instructions to call for help. Patient was given instructions on fall risk and not to get out of bed. All questions and concerns addressed with instructions to call with any issues or inadequate analgesia.   Patient tolerated the insertion well without immediate complications.Reason for block:procedure for pain

## 2020-09-18 NOTE — Lactation Note (Signed)
This note was copied from a baby's chart. Lactation Consultation Note  Patient Name: Wanda Richmond [id number] Date: 09/18/2020 Reason for consult: Follow-up assessment BAby rooting, mom had been attempting without success Maternal Data Has patient been taught Hand Expression?: Yes  Feeding Feeding Type: Breast Fed Placed skin to skin on mom's chest, shown how to hand express few drops on left breast, baby able to latch well after 1-2 attempts with shaping of breast tissue, nursing well with some stimulation, few swallows heard LATCH Score Latch: Grasps breast easily, tongue down, lips flanged, rhythmical sucking.  Audible Swallowing: A few with stimulation  Type of Nipple: Everted at rest and after stimulation  Comfort (Breast/Nipple): Soft / non-tender  Hold (Positioning): Assistance needed to correctly position infant at breast and maintain latch.  LATCH Score: 8  Interventions Interventions: Assisted with latch;Skin to skin;Hand express;Breast compression;Adjust position  Lactation Tools Discussed/Used     Consult Status Consult Status: Follow-up Date: 09/19/20 Follow-up type: In-patient    Dyann Kief 09/18/2020, 7:45 PM

## 2020-09-18 NOTE — Lactation Note (Addendum)
This note was copied from a baby's chart. Lactation Consultation Note  Patient Name: Wanda Richmond [id number] Date: 09/18/2020 Reason for consult: Initial assessment;Term   Maternal Data Formula Feeding for Exclusion: No Does the patient have breastfeeding experience prior to this delivery?: Yes  Feeding Feeding Type: Breast Fed Baby breastfeeding on left breast in cradle hold when room entered, occ swallow noted with stimulation, encouraged offering right breast when baby comes off left LATCH Score Latch: Grasps breast easily, tongue down, lips flanged, rhythmical sucking.  Audible Swallowing: A few with stimulation  Type of Nipple: Everted at rest and after stimulation  Comfort (Breast/Nipple): Soft / non-tender  Hold (Positioning): No assistance needed to correctly position infant at breast.  LATCH Score: 9  Interventions Interventions: Breast feeding basics reviewed;Skin to skin;Support pillows  Lactation Tools Discussed/Used WIC Program: Yes LC name and no written on white board  Consult Status Consult Status: PRN    Dyann Kief 09/18/2020, 1:40 PM

## 2020-09-19 LAB — CBC
HCT: 33.4 % — ABNORMAL LOW (ref 36.0–46.0)
Hemoglobin: 10.7 g/dL — ABNORMAL LOW (ref 12.0–15.0)
MCH: 31.6 pg (ref 26.0–34.0)
MCHC: 32 g/dL (ref 30.0–36.0)
MCV: 98.5 fL (ref 80.0–100.0)
Platelets: 223 10*3/uL (ref 150–400)
RBC: 3.39 MIL/uL — ABNORMAL LOW (ref 3.87–5.11)
RDW: 13.3 % (ref 11.5–15.5)
WBC: 9.8 10*3/uL (ref 4.0–10.5)
nRBC: 0 % (ref 0.0–0.2)

## 2020-09-19 MED ORDER — MEASLES, MUMPS & RUBELLA VAC IJ SOLR
0.5000 mL | Freq: Once | INTRAMUSCULAR | Status: AC
  Administered 2020-09-19: 0.5 mL via SUBCUTANEOUS
  Filled 2020-09-19 (×2): qty 0.5

## 2020-09-19 MED ORDER — IBUPROFEN 600 MG PO TABS
600.0000 mg | ORAL_TABLET | Freq: Four times a day (QID) | ORAL | 0 refills | Status: DC
Start: 2020-09-19 — End: 2021-03-09

## 2020-09-19 NOTE — Discharge Summary (Signed)
Postpartum Discharge Summary     Patient Name: Wanda Richmond DOB: Apr 22, 1991 MRN: [id number]  Date of admission: 09/18/2020 Delivery date:09/18/2020  Delivering provider: Rubie Maid  Date of discharge: 09/19/2020  Admitting diagnosis: Encounter for elective induction of labor [Z34.90] Intrauterine pregnancy: [redacted]w[redacted]d    Secondary diagnosis:  Active Problems:   GBS (group B Streptococcus carrier), +RV culture, currently pregnant   Encounter for elective induction of labor   History of herpes genitalis  Additional problems: None    Discharge diagnosis: Term Pregnancy Delivered                                              Post partum procedures:None Augmentation: Cytotec Complications: None  Hospital course: Induction of Labor With Vaginal Delivery   29y.o. yo [id number] admitted to the hospital 09/18/2020 for induction of labor.  Indication for induction: Elective.  Patient had an uncomplicated labor course as follows: Membrane Rupture Time/Date: 7:36 AM ,09/18/2020   Delivery Method:Vaginal, Spontaneous  Episiotomy: None  Lacerations:  None  Details of delivery can be found in separate delivery note.  Patient had a routine postpartum course. Patient is discharged home 09/19/20.  Newborn Data: Birth date:09/18/2020  Birth time:10:20 AM  Gender:Female  Living status:Living  Apgars:7 ,9  Weight:3220 g   Magnesium Sulfate received: No BMZ received: No Rhophylac:No MMR:Yes T-DaP:Given prenatally Flu: Yes Transfusion:No  Physical exam  Vitals:   09/18/20 1913 09/18/20 2342 09/19/20 0426 09/19/20 0759  BP: 97/60 102/65 106/72 93/62  Pulse: 69 73 (!) 58 (!) 58  Resp: _0 Temp: 98.1 F (36.7 C) 98.2 F (36.8 C) 97.8 F (36.6 C) 98.6 F (37 C)  TempSrc: Oral Oral Oral Oral  SpO2: 95% 93% 99%   Weight:      Height:       General: alert, cooperative and no distress Lochia: appropriate Uterine Fundus: firm Incision: N/A DVT Evaluation: No  evidence of DVT seen on physical exam. Negative Homan's sign. No cords or calf tenderness. Labs: Lab Results  Component Value Date   WBC 9.8 09/19/2020   HGB 10.7 (L) 09/19/2020   HCT 33.4 (L) 09/19/2020   MCV 98.5 09/19/2020   PLT 223 09/19/2020   No flowsheet data found. Edinburgh Score: Edinburgh Postnatal Depression Scale Screening Tool 09/18/2020  I have been able to laugh and see the funny side of things. 0  I have looked forward with enjoyment to things. 0  I have blamed myself unnecessarily when things went wrong. 1  I have been anxious or worried for no good reason. 2  I have felt scared or panicky for no good reason. 1  Things have been getting on top of me. 1  I have been so unhappy that I have had difficulty sleeping. 1  I have felt sad or miserable. 1  I have been so unhappy that I have been crying. 1  The thought of harming myself has occurred to me. 0  Edinburgh Postnatal Depression Scale Total 8      After visit meds:  Allergies as of 09/19/2020      Reactions   Penicillins Shortness Of Breath, Cough      Medication List    STOP taking these medications   valACYclovir 500 MG tablet Commonly known as: VALTREX     TAKE  these medications   ferrous sulfate 325 (65 FE) MG tablet Take 325 mg by mouth daily with breakfast.   ibuprofen 600 MG tablet Commonly known as: ADVIL Take 1 tablet (600 mg total) by mouth every 6 (six) hours.   multivitamin-prenatal 27-0.8 MG Tabs tablet Take 1 tablet by mouth daily at 12 noon.        Discharge home in stable condition Infant Feeding: Breast Infant Disposition:home with mother Discharge instruction: per After Visit Summary and Postpartum booklet. Activity: Advance as tolerated. Pelvic rest for 6 weeks.  Diet: routine diet Anticipated Birth Control: Unsure Postpartum Appointment:6 weeks Additional Postpartum F/U: None Future Appointments:No future appointments. Follow up Visit:  Follow-up Information     Rubie Maid, MD Follow up in 6 week(s).   Specialties: Obstetrics and Gynecology, Radiology Why: Postpartum visit Contact information: [address] [place] [address] [phone]                  09/19/2020 Rubie Maid, MD  [facility]

## 2020-09-19 NOTE — Anesthesia Postprocedure Evaluation (Signed)
Anesthesia Post Note  Patient: Wanda Richmond  Procedure(s) Performed: AN AD HOC LABOR EPIDURAL  Patient location during evaluation: Mother Baby Anesthesia Type: Epidural Level of consciousness: awake and alert Pain management: pain level controlled Vital Signs Assessment: post-procedure vital signs reviewed and stable Respiratory status: spontaneous breathing, nonlabored ventilation and respiratory function stable Cardiovascular status: stable Postop Assessment: no headache, no backache and epidural receding Anesthetic complications: no   No complications documented.   Last Vitals:  Vitals:   09/19/20 0426 09/19/20 0759  BP: 106/72 93/62  Pulse: (!) 58 (!) 58  Resp: 18 17  Temp: 36.6 C 37 C  SpO2: 99%     Last Pain:  Vitals:   09/19/20 0759  TempSrc: Oral  PainSc:                  Tadd Holtmeyer K

## 2020-09-19 NOTE — Progress Notes (Addendum)
Post Partum Day # 1, s/p SVD  Subjective: no complaints, up ad lib, voiding and tolerating PO. Notes breastfeeding is going well.  Objective: Temp:  [97.8 F (36.6 C)-98.6 F (37 C)] 98.6 F (37 C) (10/30 0759) Pulse Rate:  [58-85] 58 (10/30 0759) Resp:  [17-20] 17 (10/30 0759) BP: (93-111)/(60-73) 93/62 (10/30 0759) SpO2:  [93 %-99 %] 99 % (10/30 0426)  Physical Exam:  General: alert and no distress  Lungs: clear to auscultation bilaterally Breasts: normal appearance, no masses or tenderness Heart: regular rate and rhythm, S1, S2 normal, no murmur, click, rub or gallop Abdomen: soft, non-tender; bowel sounds normal; no masses,  no organomegaly Pelvis: Lochia: appropriate, Uterine Fundus: firm Extremities: DVT Evaluation: No evidence of DVT seen on physical exam. Negative Homan's sign. No cords or calf tenderness. No significant calf/ankle edema.  Recent Labs    09/18/20 0052 09/19/20 0602  HGB 11.1* 10.7*  HCT 33.3* 33.4*    Assessment/Plan: Doing well postpartum,  Breastfeeding, Lactation consult  Contraception: undecided Mild anemia of pregnancy. Asymptomatic.  Continue PNV with iron.   Rubella non-immune, for vaccination postpartum.  Can d/c home later today or tomorrow depending on baby's status.     LOS: 1 day   Hildred Laser, MD [facility] 09/19/2020 10:59 AM

## 2020-09-19 NOTE — Progress Notes (Signed)
Pt discharged with infant.  Discharge instructions, prescriptions and follow up appointment given to and reviewed with pt. Pt verbalized understanding. Escorted out by auxillary. 

## 2020-09-21 DIAGNOSIS — Z419 Encounter for procedure for purposes other than remedying health state, unspecified: Secondary | ICD-10-CM | POA: Diagnosis not present

## 2020-10-21 DIAGNOSIS — Z419 Encounter for procedure for purposes other than remedying health state, unspecified: Secondary | ICD-10-CM | POA: Diagnosis not present

## 2020-10-22 ENCOUNTER — Other Ambulatory Visit: Payer: Self-pay

## 2020-10-22 ENCOUNTER — Encounter: Payer: Self-pay | Admitting: Obstetrics and Gynecology

## 2020-10-22 ENCOUNTER — Ambulatory Visit (INDEPENDENT_AMBULATORY_CARE_PROVIDER_SITE_OTHER): Payer: Medicaid Other | Admitting: Obstetrics and Gynecology

## 2020-10-22 DIAGNOSIS — O9081 Anemia of the puerperium: Secondary | ICD-10-CM

## 2020-10-22 DIAGNOSIS — Z3009 Encounter for other general counseling and advice on contraception: Secondary | ICD-10-CM

## 2020-10-22 NOTE — Progress Notes (Signed)
  PT is present today for her postpartum visit. Pt stated that she is breastfeeding and have had sexually intercourse recently; w/o protection. Pt stated that she would like to discuss birth control; due to unsure of which method would be best for her. EPDS=3 .  Pt stated that she is doing well no complaints.

## 2020-10-22 NOTE — Patient Instructions (Signed)
Ethinyl Estradiol; Norelgestromin skin patches What is this medicine? ETHINYL ESTRADIOL;NORELGESTROMIN (ETH in il es tra DYE ole; nor el JES troe min) skin patch is used as a contraceptive (birth control method). This medicine combines two types of female hormones, an estrogen and a progestin. This patch is used to prevent ovulation and pregnancy. This medicine may be used for other purposes; ask your health care provider or pharmacist if you have questions. COMMON BRAND NAME(S): Ortho Evra, Xulane What should I tell my health care provider before I take this medicine? They need to know if you have or ever had any of these conditions:  abnormal vaginal bleeding  blood vessel disease or blood clots  breast, cervical, endometrial, ovarian, liver, or uterine cancer  diabetes  gallbladder disease  having surgery  heart disease or recent heart attack  high blood pressure  high cholesterol or triglycerides  history of irregular heartbeat or heart valve problems  kidney disease  liver disease  migraine headaches  protein C deficiency  protein S deficiency  recently had a baby, miscarriage, or abortion  stroke  systemic lupus erythematosus (SLE)  tobacco smoker  an unusual or allergic reaction to estrogens, progestins, other medicines, foods, dyes, or preservatives  pregnant or trying to get pregnant  breast-feeding How should I use this medicine? This patch is applied to the skin. Follow the directions on the prescription label. Apply to clean, dry, healthy skin on the buttock, abdomen, upper outer arm or upper torso, in a place where it will not be rubbed by tight clothing. Do not use lotions or other cosmetics on the site where the patch will go. Press the patch firmly in place for 10 seconds to ensure good contact with the skin. Change the patch every 7 days on the same day of the week for 3 weeks. You will then have a break from the patch for 1 week, after which you  will apply a new patch. Do not use your medicine more often than directed. Contact your pediatrician regarding the use of this medicine in children. Special care may be needed. This medicine has been used in female children who have started having menstrual periods. A patient package insert for the product will be given with each prescription and refill. Read this sheet carefully each time. The sheet may change frequently. Overdosage: If you think you have taken too much of this medicine contact a poison control center or emergency room at once. NOTE: This medicine is only for you. Do not share this medicine with others. What if I miss a dose? You will need to replace your patch once a week as directed. If your patch is lost or falls off, contact your health care professional for advice. You may need to use another form of birth control if your patch has been off for more than 1 day. What may interact with this medicine? Do not take this medicine with the following medications:  dasabuvir; ombitasvir; paritaprevir; ritonavir  ombitasvir; paritaprevir; ritonavir This medicine may also interact with the following medications:  acetaminophen  antibiotics or medicines for infections, especially rifampin, rifabutin, rifapentine, and possibly penicillins or tetracyclines  aprepitant or fosaprepitant  armodafinil  ascorbic acid (vitamin C)  barbiturate medicines, such as phenobarbital or primidone  bosentan  certain antiviral medicines for hepatitis, HIV or AIDS  certain medicines for cancer treatment  certain medicines for seizures like carbamazepine, clobazam, felbamate, lamotrigine, oxcarbazepine, phenytoin, rufinamide, topiramate  certain medicines for treating high cholesterol  cyclosporine    dantrolene  elagolix  flibanserin  grapefruit juice  lesinurad  medicines for diabetes  medicines to treat fungal infections, such as griseofulvin, miconazole, fluconazole,  ketoconazole, itraconazole, posaconazole or voriconazole  mifepristone  mitotane  modafinil  morphine  mycophenolate  St. John's wort  tamoxifen  temazepam  theophylline or aminophylline  thyroid hormones  tizanidine  tranexamic acid  ulipristal  warfarin This list may not describe all possible interactions. Give your health care provider a list of all the medicines, herbs, non-prescription drugs, or dietary supplements you use. Also tell them if you smoke, drink alcohol, or use illegal drugs. Some items may interact with your medicine. What should I watch for while using this medicine? Visit your doctor or health care professional for regular checks on your progress. You will need a regular breast and pelvic exam and Pap smear while on this medicine. Use an additional method of contraception during the first cycle that you use this patch. If you have any reason to think you are pregnant, stop using this medicine right away and contact your doctor or health care professional. If you are using this medicine for hormone related problems, it may take several cycles of use to see improvement in your condition. Smoking increases the risk of getting a blood clot or having a stroke while you are using hormonal birth control, especially if you are more than 27 years old. You are strongly advised not to smoke. This medicine can make your body retain fluid, making your fingers, hands, or ankles swell. Your blood pressure can go up. Contact your doctor or health care professional if you feel you are retaining fluid. This medicine can make you more sensitive to the sun. Keep out of the sun. If you cannot avoid being in the sun, wear protective clothing and use sunscreen. Do not use sun lamps or tanning beds/booths. If you wear contact lenses and notice visual changes, or if the lenses begin to feel uncomfortable, consult your eye care specialist. In some women, tenderness, swelling, or  minor bleeding of the gums may occur. Notify your dentist if this happens. Brushing and flossing your teeth regularly may help limit this. See your dentist regularly and inform your dentist of the medicines you are taking. If you are going to have elective surgery or a MRI, you may need to stop using this medicine before the surgery or MRI. Consult your health care professional for advice. This medicine does not protect you against HIV infection (AIDS) or any other sexually transmitted diseases. What side effects may I notice from receiving this medicine? Side effects that you should report to your doctor or health care professional as soon as possible:  allergic reactions such as skin rash or itching, hives, swelling of the lips, mouth, tongue, or throat  breast tissue changes or discharge  dark patches of skin on your forehead, cheeks, upper lip, and chin  depression  high blood pressure  migraines or severe, sudden headaches  missed menstrual periods  signs and symptoms of a blood clot such as breathing problems; changes in vision; chest pain; severe, sudden headache; pain, swelling, warmth in the leg; trouble speaking; sudden numbness or weakness of the face, arm or leg  skin reactions at the patch site such as blistering, bleeding, itching, rash, or swelling  stomach pain  yellowing of the eyes or skin Side effects that usually do not require medical attention (report these to your doctor or health care professional if they continue or are bothersome):    breast tenderness  irregular vaginal bleeding or spotting, particularly during the first 3 months of use  headache  nausea  painful menstrual periods  skin redness or mild irritation at site where applied  weight gain (slight) This list may not describe all possible side effects. Call your doctor for medical advice about side effects. You may report side effects to FDA at 1-800-FDA-1088. Where should I keep my  medicine? Keep out of the reach of children. Store at room temperature between 15 and 30 degrees C (59 and 86 degrees F). Keep the patch in its pouch until time of use. Throw away any unused medicine after the expiration date. Dispose of used patches properly. Since a used patch may still contain active hormones, fold the patch in half so that it sticks to itself prior to disposal. Throw away in a place where children or pets cannot reach. NOTE: This sheet is a summary. It may not cover all possible information. If you have questions about this medicine, talk to your doctor, pharmacist, or health care provider.  2020 Elsevier/Gold Standard (2019-02-12 11:56:29)  

## 2020-10-22 NOTE — Progress Notes (Signed)
   OBSTETRICS POSTPARTUM CLINIC PROGRESS NOTE  Subjective:     Wanda Richmond is a 29 y.o. G17P2012 female who presents for a postpartum visit. She is 6 weeks postpartum following a spontaneous vaginal delivery. I have fully reviewed the prenatal and intrapartum course. The delivery was at 39.4 gestational weeks.  Anesthesia: epidural. Postpartum course has been well. Baby's course has been well. Baby is feeding by bottle - Similac Sensitive RS. Bleeding: patient has not resumed menses, with No LMP recorded.  However did start spotting today so may be the start of a cycle. Bowel function is normal. Bladder function is normal. Patient is sexually active, unprotected (occurred yesterday). Contraception method desired is undecided. Postpartum depression screening: negative EPDS = 3.  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   The following portions of the patient's history were reviewed and updated as appropriate: allergies, current medications, past family history, past medical history, past social history, past surgical history and problem list.  Review of Systems Pertinent items noted in HPI and remainder of comprehensive ROS otherwise negative.   Objective:    BP 107/72   Pulse 70   Ht 5\' 6"  (1.676 m)   Wt 177 lb (80.3 kg)   Breastfeeding No   BMI 28.57 kg/m   General:  alert and no distress   Breasts:  inspection negative, no nipple discharge or bleeding, no masses or nodularity palpable  Lungs: clear to auscultation bilaterally  Heart:  regular rate and rhythm, S1, S2 normal, no murmur, click, rub or gallop  Abdomen: soft, non-tender; bowel sounds normal; no masses,  no organomegaly.     Vulva:  normal  Vagina: normal vagina, no discharge, exudate, lesion, or erythema. Scant blood in vaginal vault.  Cervix:  no cervical motion tenderness and no lesions  Corpus: normal size, contour, position, consistency, mobility, non-tender  Adnexa:  normal adnexa and  no mass, fullness, tenderness  Rectal Exam: Not performed.         Labs:  Lab Results  Component Value Date   HGB 10.7 (L) 09/19/2020    Assessment:   1. Postpartum care following vaginal delivery   2. Encounter for counseling regarding contraception   3. Postpartum anemia     Plan:    1. Contraception: Reviewed all forms of birth control options available including abstinence; fertility period awareness methods; over the counter/barrier methods; hormonal contraceptive medication including pill, patch, ring, injection,contraceptive implant; hormonal and nonhormonal IUDs; permanent sterilization options were not reviewed.  Risks and benefits reviewed.  Questions were answered.  patient now desiring to try contraceptive patch. Information was given to patient to review. Samples (x 2) given in the office. For Sunday start. Advised on backup method for next 2 weeks.  2. Mild postpartum anemia, asymptomatic, above 10 at discharge so will not rescreen.   3. Follow up in: 4-6 months for annual exam.     Sunday, MD [facility]

## 2020-11-21 DIAGNOSIS — Z419 Encounter for procedure for purposes other than remedying health state, unspecified: Secondary | ICD-10-CM | POA: Diagnosis not present

## 2020-11-30 DIAGNOSIS — H6692 Otitis media, unspecified, left ear: Secondary | ICD-10-CM | POA: Diagnosis not present

## 2020-12-01 DIAGNOSIS — Z03818 Encounter for observation for suspected exposure to other biological agents ruled out: Secondary | ICD-10-CM | POA: Diagnosis not present

## 2020-12-01 DIAGNOSIS — Z20822 Contact with and (suspected) exposure to covid-19: Secondary | ICD-10-CM | POA: Diagnosis not present

## 2020-12-09 DIAGNOSIS — Z20822 Contact with and (suspected) exposure to covid-19: Secondary | ICD-10-CM | POA: Diagnosis not present

## 2020-12-09 DIAGNOSIS — Z03818 Encounter for observation for suspected exposure to other biological agents ruled out: Secondary | ICD-10-CM | POA: Diagnosis not present

## 2020-12-22 DIAGNOSIS — Z419 Encounter for procedure for purposes other than remedying health state, unspecified: Secondary | ICD-10-CM | POA: Diagnosis not present

## 2021-01-13 DIAGNOSIS — N760 Acute vaginitis: Secondary | ICD-10-CM | POA: Diagnosis not present

## 2021-01-19 DIAGNOSIS — Z419 Encounter for procedure for purposes other than remedying health state, unspecified: Secondary | ICD-10-CM | POA: Diagnosis not present

## 2021-02-19 DIAGNOSIS — Z419 Encounter for procedure for purposes other than remedying health state, unspecified: Secondary | ICD-10-CM | POA: Diagnosis not present

## 2021-03-09 ENCOUNTER — Encounter: Payer: Self-pay | Admitting: Obstetrics and Gynecology

## 2021-03-09 ENCOUNTER — Ambulatory Visit (INDEPENDENT_AMBULATORY_CARE_PROVIDER_SITE_OTHER): Payer: Medicaid Other | Admitting: Obstetrics and Gynecology

## 2021-03-09 ENCOUNTER — Other Ambulatory Visit: Payer: Self-pay

## 2021-03-09 VITALS — BP 95/60 | HR 91 | Ht 66.0 in | Wt 176.3 lb

## 2021-03-09 DIAGNOSIS — B9689 Other specified bacterial agents as the cause of diseases classified elsewhere: Secondary | ICD-10-CM

## 2021-03-09 DIAGNOSIS — N760 Acute vaginitis: Secondary | ICD-10-CM

## 2021-03-09 DIAGNOSIS — Z01419 Encounter for gynecological examination (general) (routine) without abnormal findings: Secondary | ICD-10-CM | POA: Diagnosis not present

## 2021-03-09 DIAGNOSIS — Z331 Pregnant state, incidental: Secondary | ICD-10-CM | POA: Diagnosis not present

## 2021-03-09 DIAGNOSIS — Z3009 Encounter for other general counseling and advice on contraception: Secondary | ICD-10-CM | POA: Diagnosis not present

## 2021-03-09 DIAGNOSIS — Z0000 Encounter for general adult medical examination without abnormal findings: Secondary | ICD-10-CM | POA: Diagnosis not present

## 2021-03-09 DIAGNOSIS — Z3201 Encounter for pregnancy test, result positive: Secondary | ICD-10-CM

## 2021-03-09 LAB — POCT URINE PREGNANCY: Preg Test, Ur: POSITIVE — AB

## 2021-03-09 MED ORDER — METRONIDAZOLE 500 MG PO TABS: 500.0000 mg | ORAL_TABLET | Freq: Two times a day (BID) | ORAL | 0 refills | Status: DC

## 2021-03-09 NOTE — Progress Notes (Signed)
GYNECOLOGY ANNUAL PHYSICAL EXAM PROGRESS NOTE  Subjective:    Wanda Richmond is a 30 y.o. [id number] female who presents for an annual exam. The patient is sexually active. The patient wears seatbelts: yes. The patient participates in regular exercise: no. Has the patient ever been transfused or tattooed?: no. The patient reports that there is not domestic violence in her life.    The patient has the following concerns today:  1. Reports that she thinks she has another vaginal infection. Reports being seen at a doctor's office a few months ago, and was diagnosed with a bacterial infection. Was given a prescription for antibiotics, but patient reports that she did not finish full course due to feeling better. Thinks the infection may have returned as she is having similar symptoms (vaginal discharge, odor, mild irritation).   2. Also desires to discuss contraception options today. Currently not on any form of contraception since birth of her child 6 months ago.    Menstrual History: Menarche age: 4 Patient's last menstrual period was 01/22/2021 (approximate). Reports cycles are normal, regularly occurring every 28-30 days, last 4-5 days, light to moderate flow.   Gynecologic History Contraception: none History of STI's: H/o HSV Last Pap: 04/07/2020. Results were: normal.  Denies h/o abnormal pap smears.   OB History  Gravida Para Term Preterm AB Living  3 2 2  0 1 2  SAB IAB Ectopic Multiple Live Births  1 0 0 0 2    # Outcome Date GA Lbr Len/2nd Weight Sex Delivery Anes PTL Lv  3 Term 09/18/20 [redacted]w[redacted]d 00:25 / 00:11 7 lb 1.6 oz (3.22 kg) M Vag-Spont EPI  LIV     Name: Lund,BOY Renelle     Apgar1: 7  Apgar5: 9  2 SAB 2015          1 Term 2013 [redacted]w[redacted]d  8 lb 2 oz (3.685 kg) F Vag-Spont  Y LIV    Past Medical History:  Diagnosis Date  . Anemia   . History of herpes genitalis 09/18/2020    Past Surgical History:  Procedure Laterality Date  . WISDOM TOOTH EXTRACTION       Family History  Problem Relation Age of Onset  . Healthy Mother   . Healthy Father   . Breast cancer Paternal Grandmother     Social History   Socioeconomic History  . Marital status: Married    Spouse name: 09/20/2020  . Number of children: Not on file  . Years of education: Not on file  . Highest education level: Not on file  Occupational History  . Not on file  Tobacco Use  . Smoking status: Never Smoker  . Smokeless tobacco: Never Used  Vaping Use  . Vaping Use: Never used  Substance and Sexual Activity  . Alcohol use: Yes  . Drug use: Never  . Sexual activity: Yes    Birth control/protection: None  Other Topics Concern  . Not on file  Social History Narrative  . Not on file   Social Determinants of Health   Financial Resource Strain: Not on file  Food Insecurity: Not on file  Transportation Needs: Not on file  Physical Activity: Not on file  Stress: Not on file  Social Connections: Not on file  Intimate Partner Violence: Not on file    Current Outpatient Medications on File Prior to Visit  Medication Sig Dispense Refill  . ferrous sulfate 325 (65 FE) MG tablet Take 325 mg by mouth daily with breakfast.    .  ibuprofen (ADVIL) 600 MG tablet Take 1 tablet (600 mg total) by mouth every 6 (six) hours. 30 tablet 0  . Prenatal Vit-Fe Fumarate-FA (MULTIVITAMIN-PRENATAL) 27-0.8 MG TABS tablet Take 1 tablet by mouth daily at 12 noon.     No current facility-administered medications on file prior to visit.    Allergies  Allergen Reactions  . Penicillins Shortness Of Breath and Cough      Review of Systems Constitutional: negative for chills, fatigue, fevers and sweats Eyes: negative for irritation, redness and visual disturbance Ears, nose, mouth, throat, and face: negative for hearing loss, nasal congestion, snoring and tinnitus Respiratory: negative for asthma, cough, sputum Cardiovascular: negative for chest pain, dyspnea, exertional chest  pressure/discomfort, irregular heart beat, palpitations and syncope Gastrointestinal: negative for abdominal pain, change in bowel habits, nausea and vomiting Genitourinary: negative for abnormal menstrual periods, genital lesions, sexual problems and vaginal discharge, dysuria and urinary incontinence Integument/breast: negative for breast lump, breast tenderness and nipple discharge Hematologic/lymphatic: negative for bleeding and easy bruising Musculoskeletal:negative for back pain and muscle weakness Neurological: negative for dizziness, headaches, vertigo and weakness Endocrine: negative for diabetic symptoms including polydipsia, polyuria and skin dryness Allergic/Immunologic: negative for hay fever and urticaria        Objective:  Blood pressure 95/60, pulse 91, height 5\' 6"  (1.676 m), weight 176 lb 4.8 oz (80 kg), not currently breastfeeding. Body mass index is 28.46 kg/m.     General Appearance:    Alert, cooperative, no distress, appears stated age  Head:    Normocephalic, without obvious abnormality, atraumatic  Eyes:    PERRL, conjunctiva/corneas clear, EOM's intact, both eyes  Ears:    Normal external ear canals, both ears  Nose:   Nares normal, septum midline, mucosa normal, no drainage or sinus tenderness  Throat:   Lips, mucosa, and tongue normal; teeth and gums normal  Neck:   Supple, symmetrical, trachea midline, no adenopathy; thyroid: no enlargement/tenderness/nodules; no carotid bruit or JVD  Back:     Symmetric, no curvature, ROM normal, no CVA tenderness  Lungs:     Clear to auscultation bilaterally, respirations unlabored  Chest Wall:    No tenderness or deformity   Heart:    Regular rate and rhythm, S1 and S2 normal, no murmur, rub or gallop  Breast Exam:    No tenderness, masses, or nipple abnormality  Abdomen:     Soft, non-tender, bowel sounds active all four quadrants, no masses, no organomegaly.    Genitalia:    Pelvic:external genitalia normal, vagina  without lesions or tenderness, moderate amount of thin white vaginal discharge present. Rectovaginal septum  normal. Cervix normal in appearance, no cervical motion tenderness, no adnexal masses or tenderness.  Uterus normal size, shape, mobile, regular contours, nontender.  Rectal:    Normal external sphincter.  No hemorrhoids appreciated. Internal exam not done.   Extremities:   Extremities normal, atraumatic, no cyanosis or edema  Pulses:   2+ and symmetric all extremities  Skin:   Skin color, texture, turgor normal, no rashes or lesions  Lymph nodes:   Cervical, supraclavicular, and axillary nodes normal  Neurologic:   CNII-XII intact, normal strength, sensation and reflexes throughout   .  Labs:  Lab Results  Component Value Date   WBC 9.8 09/19/2020   HGB 10.7 (L) 09/19/2020   HCT 33.4 (L) 09/19/2020   MCV 98.5 09/19/2020   PLT 223 09/19/2020    No results found for: CREATININE, BUN, NA, K, CL, CO2  No results  found for: ALT, AST, GGT, ALKPHOS, BILITOT  No results found for: TSH   Assessment:   1. Well woman exam with routine gynecological exam   2. Birth control counseling   3. Pregnancy test positive for incidental pregnancy   4. Bacterial vaginitis      Plan:    - Blood tests: CBC with diff and Comprehensive metabolic panel. - Breast self exam technique reviewed and patient encouraged to perform self-exam monthly. - Contraception: patient initially desired to discuss options, however pregnancy test performed today was positive. Assessed patient's desires for continuation vs termination of current pregnancy.  Notes that she would not likely keep the pregnancy as she recently had a baby 6 months ago and this was unplanned.  Given information regarding termination clinics in the area. Will also work to schedule ultrasound to confirm dates, 6.4 weeks today by LMP, with EDD 10/29/2021. If she proceeds with termination, can return for birth control management. Briefly  discussed options, thinks she would like to consider combined OCPs.   - Discussed healthy lifestyle modifications. - Pap smear up to date. - COVID vaccination status: declined.  - STD screening performed with [facility] (patient ok to perform). Will go ahead and treat for suspected BV infection based on symptoms and recent history of partially treated infection.  Prescribed Flagyl.  - Follow up in 1 year for annual exam. To f/u sooner if termination of pregnancy completed, or to begin prenatal care if she changes her mind regarding the pregnancy.    Hildred Laser, MD [facility]

## 2021-03-09 NOTE — Progress Notes (Signed)
Pt present for postpartum care. Pt stated that she is not breastfeeding, had several intercourse without any protections, pt would like to have OCP for birth control UPT-positive. EPDS=6.

## 2021-03-09 NOTE — Patient Instructions (Signed)
Health Maintenance, Female Adopting a healthy lifestyle and getting preventive care are important in promoting health and wellness. Ask your health care provider about:  The right schedule for you to have regular tests and exams.  Things you can do on your own to prevent diseases and keep yourself healthy. What should I know about diet, weight, and exercise? Eat a healthy diet  Eat a diet that includes plenty of vegetables, fruits, low-fat dairy products, and lean protein.  Do not eat a lot of foods that are high in solid fats, added sugars, or sodium.   Maintain a healthy weight Body mass index (BMI) is used to identify weight problems. It estimates body fat based on height and weight. Your health care provider can help determine your BMI and help you achieve or maintain a healthy weight. Get regular exercise Get regular exercise. This is one of the most important things you can do for your health. Most adults should:  Exercise for at least 150 minutes each week. The exercise should increase your heart rate and make you sweat (moderate-intensity exercise).  Do strengthening exercises at least twice a week. This is in addition to the moderate-intensity exercise.  Spend less time sitting. Even light physical activity can be beneficial. Watch cholesterol and blood lipids Have your blood tested for lipids and cholesterol at 28 years of age, then have this test every 5 years. Have your cholesterol levels checked more often if:  Your lipid or cholesterol levels are high.  You are older than 28 years of age.  You are at high risk for heart disease. What should I know about cancer screening? Depending on your health history and family history, you may need to have cancer screening at various ages. This may include screening for:  Breast cancer.  Cervical cancer.  Colorectal cancer.  Skin cancer.  Lung cancer. What should I know about heart disease, diabetes, and high blood  pressure? Blood pressure and heart disease  High blood pressure causes heart disease and increases the risk of stroke. This is more likely to develop in people who have high blood pressure readings, are of African descent, or are overweight.  Have your blood pressure checked: ? Every 3-5 years if you are 18-39 years of age. ? Every year if you are 40 years old or older. Diabetes Have regular diabetes screenings. This checks your fasting blood sugar level. Have the screening done:  Once every three years after age 40 if you are at a normal weight and have a low risk for diabetes.  More often and at a younger age if you are overweight or have a high risk for diabetes. What should I know about preventing infection? Hepatitis B If you have a higher risk for hepatitis B, you should be screened for this virus. Talk with your health care provider to find out if you are at risk for hepatitis B infection. Hepatitis C Testing is recommended for:  Everyone born from 1945 through 1965.  Anyone with known risk factors for hepatitis C. Sexually transmitted infections (STIs)  Get screened for STIs, including gonorrhea and chlamydia, if: ? You are sexually active and are younger than 28 years of age. ? You are older than 28 years of age and your health care provider tells you that you are at risk for this type of infection. ? Your sexual activity has changed since you were last screened, and you are at increased risk for chlamydia or gonorrhea. Ask your health care provider   if you are at risk.  Ask your health care provider about whether you are at high risk for HIV. Your health care provider may recommend a prescription medicine to help prevent HIV infection. If you choose to take medicine to prevent HIV, you should first get tested for HIV. You should then be tested every 3 months for as long as you are taking the medicine. Pregnancy  If you are about to stop having your period (premenopausal) and  you may become pregnant, seek counseling before you get pregnant.  Take 400 to 800 micrograms (mcg) of folic acid every day if you become pregnant.  Ask for birth control (contraception) if you want to prevent pregnancy. Osteoporosis and menopause Osteoporosis is a disease in which the bones lose minerals and strength with aging. This can result in bone fractures. If you are 65 years old or older, or if you are at risk for osteoporosis and fractures, ask your health care provider if you should:  Be screened for bone loss.  Take a calcium or vitamin D supplement to lower your risk of fractures.  Be given hormone replacement therapy (HRT) to treat symptoms of menopause. Follow these instructions at home: Lifestyle  Do not use any products that contain nicotine or tobacco, such as cigarettes, e-cigarettes, and chewing tobacco. If you need help quitting, ask your health care provider.  Do not use street drugs.  Do not share needles.  Ask your health care provider for help if you need support or information about quitting drugs. Alcohol use  Do not drink alcohol if: ? Your health care provider tells you not to drink. ? You are pregnant, may be pregnant, or are planning to become pregnant.  If you drink alcohol: ? Limit how much you use to 0-1 drink a day. ? Limit intake if you are breastfeeding.  Be aware of how much alcohol is in your drink. In the [place] one drink equals one 12 oz bottle of beer (355 mL), one 5 oz glass of wine (148 mL), or one 1 oz glass of hard liquor (44 mL). General instructions  Schedule regular health, dental, and eye exams.  Stay current with your vaccines.  Tell your health care provider if: ? You often feel depressed. ? You have ever been abused or do not feel safe at home. Summary  Adopting a healthy lifestyle and getting preventive care are important in promoting health and wellness.  Follow your health care provider's instructions about healthy  diet, exercising, and getting tested or screened for diseases.  Follow your health care provider's instructions on monitoring your cholesterol and blood pressure. This information is not intended to replace advice given to you by your health care provider. Make sure you discuss any questions you have with your health care provider. Document Revised: 10/31/2018 Document Reviewed: 10/31/2018 Elsevier Patient Education  2021 Elsevier Inc.    Breast Self-Awareness Breast self-awareness means being familiar with how your breasts look and feel. It involves checking your breasts regularly and reporting any changes to your health care provider. Practicing breast self-awareness is important. Sometimes changes may not be harmful (are benign), but sometimes a change in your breasts can be a sign of a serious medical problem. It is important to learn how to do this procedure correctly so that you can catch problems early, when treatment is more likely to be successful. All women should practice breast self-awareness, including women who have had breast implants. What you need:  A mirror.  A   well-lit room. How to do a breast self-exam A breast self-exam is one way to learn what is normal for your breasts and whether your breasts are changing. To do a breast self-exam: Look for changes 1. Remove all the clothing above your waist. 2. Stand in front of a mirror in a room with good lighting. 3. Put your hands on your hips. 4. Push your hands firmly downward. 5. Compare your breasts in the mirror. Look for differences between them (asymmetry), such as: ? Differences in shape. ? Differences in size. ? Puckers, dips, and bumps in one breast and not the other. 6. Look at each breast for changes in the skin, such as: ? Redness. ? Scaly areas. 7. Look for changes in your nipples, such as: ? Discharge. ? Bleeding. ? Dimpling. ? Redness. ? A change in position.   Feel for changes Carefully feel your  breasts for lumps and changes. It is best to do this while lying on your back on the floor, and again while sitting or standing in the tub or shower with soapy water on your skin. Feel each breast in the following way: 1. Place the arm on the side of the breast you are examining above your head. 2. Feel your breast with the other hand. 3. Start in the nipple area and make -inch (2 cm) overlapping circles to feel your breast. Use the pads of your three middle fingers to do this. Apply light pressure, then medium pressure, then firm pressure. The light pressure will allow you to feel the tissue closest to the skin. The medium pressure will allow you to feel the tissue that is a little deeper. The firm pressure will allow you to feel the tissue close to the ribs. 4. Continue the overlapping circles, moving downward over the breast until you feel your ribs below your breast. 5. Move one finger-width toward the center of the body. Continue to use the -inch (2 cm) overlapping circles to feel your breast as you move slowly up toward your collarbone. 6. Continue the up-and-down exam using all three pressures until you reach your armpit.   Write down what you find Writing down what you find can help you remember what to discuss with your health care provider. Write down:  What is normal for each breast.  Any changes that you find in each breast, including: ? The kind of changes you find. ? Any pain or tenderness. ? Size and location of any lumps.  Where you are in your menstrual cycle, if you are still menstruating. General tips and recommendations  Examine your breasts every month.  If you are breastfeeding, the best time to examine your breasts is after a feeding or after using a breast pump.  If you menstruate, the best time to examine your breasts is 5-7 days after your period. Breasts are generally lumpier during menstrual periods, and it may be more difficult to notice changes.  With time  and practice, you will become more familiar with the variations in your breasts and more comfortable with the exam. Contact a health care provider if you:  See a change in the shape or size of your breasts or nipples.  See a change in the skin of your breast or nipples, such as a reddened or scaly area.  Have unusual discharge from your nipples.  Find a lump or thick area that was not there before.  Have pain in your breasts.  Have any concerns related to your breast health.   Summary  Breast self-awareness includes looking for physical changes in your breasts, as well as feeling for any changes within your breasts.  Breast self-awareness should be performed in front of a mirror in a well-lit room.  You should examine your breasts every month. If you menstruate, the best time to examine your breasts is 5-7 days after your menstrual period.  Let your health care provider know of any changes you notice in your breasts, including changes in size, changes on the skin, pain or tenderness, or unusual fluid from your nipples. This information is not intended to replace advice given to you by your health care provider. Make sure you discuss any questions you have with your health care provider. Document Revised: 06/26/2018 Document Reviewed: 06/26/2018 Elsevier Patient Education  2021 Elsevier Inc.   

## 2021-03-10 LAB — COMPREHENSIVE METABOLIC PANEL
ALT: 13 IU/L (ref 0–32)
AST: 11 IU/L (ref 0–40)
Albumin/Globulin Ratio: 1.5 (ref 1.2–2.2)
Albumin: 4.2 g/dL (ref 3.9–5.0)
Alkaline Phosphatase: 58 IU/L (ref 44–121)
BUN/Creatinine Ratio: 23 (ref 9–23)
BUN: 14 mg/dL (ref 6–20)
Bilirubin Total: <0.2 mg/dL (ref 0.0–1.2)
CO2: 21 mmol/L (ref 20–29)
Calcium: 8.5 mg/dL — ABNORMAL LOW (ref 8.7–10.2)
Chloride: 103 mmol/L (ref 96–106)
Creatinine, Ser: 0.61 mg/dL (ref 0.57–1.00)
Globulin, Total: 2.8 g/dL (ref 1.5–4.5)
Glucose: 85 mg/dL (ref 65–99)
Potassium: 4 mmol/L (ref 3.5–5.2)
Sodium: 139 mmol/L (ref 134–144)
Total Protein: 7 g/dL (ref 6.0–8.5)
eGFR: 126 mL/min/{1.73_m2} (ref >59)

## 2021-03-10 LAB — CBC
Hematocrit: 38.7 % (ref 34.0–46.6)
Hemoglobin: 12.3 g/dL (ref 11.1–15.9)
MCH: 30.7 pg (ref 26.6–33.0)
MCHC: 31.8 g/dL (ref 31.5–35.7)
MCV: 97 fL (ref 79–97)
Platelets: 285 10*3/uL (ref 150–450)
RBC: 4.01 x10E6/uL (ref 3.77–5.28)
RDW: 11.8 % (ref 11.7–15.4)
WBC: 6.3 10*3/uL (ref 3.4–10.8)

## 2021-03-10 LAB — HUMAN CHORIONIC GONADOTROPIN(HCG),B-SUBUNIT,QUANTITATIVE): HCG, Beta Chain, Quant, S: 35220 m[IU]/mL

## 2021-03-11 LAB — NUSWAB VAGINITIS PLUS (VG+)
Atopobium vaginae: HIGH Score — AB
BVAB 2: HIGH Score — AB
Candida albicans, NAA: NEGATIVE
Candida glabrata, NAA: NEGATIVE
Chlamydia trachomatis, NAA: NEGATIVE
Megasphaera 1: HIGH Score — AB
Neisseria gonorrhoeae, NAA: NEGATIVE
Trich vag by NAA: NEGATIVE

## 2021-03-21 DIAGNOSIS — Z419 Encounter for procedure for purposes other than remedying health state, unspecified: Secondary | ICD-10-CM | POA: Diagnosis not present

## 2021-04-01 DIAGNOSIS — Z03818 Encounter for observation for suspected exposure to other biological agents ruled out: Secondary | ICD-10-CM | POA: Diagnosis not present

## 2021-04-01 DIAGNOSIS — Z20822 Contact with and (suspected) exposure to covid-19: Secondary | ICD-10-CM | POA: Diagnosis not present

## 2021-04-21 DIAGNOSIS — Z419 Encounter for procedure for purposes other than remedying health state, unspecified: Secondary | ICD-10-CM | POA: Diagnosis not present

## 2021-04-29 ENCOUNTER — Ambulatory Visit: Admission: RE | Admit: 2021-04-29 | Payer: Medicaid Other | Source: Ambulatory Visit

## 2021-05-12 ENCOUNTER — Ambulatory Visit (INDEPENDENT_AMBULATORY_CARE_PROVIDER_SITE_OTHER): Payer: Medicaid Other | Admitting: Obstetrics and Gynecology

## 2021-05-12 ENCOUNTER — Other Ambulatory Visit: Payer: Self-pay

## 2021-05-12 ENCOUNTER — Encounter: Payer: Self-pay | Admitting: Obstetrics and Gynecology

## 2021-05-12 VITALS — BP 100/67 | HR 83 | Ht 66.0 in | Wt 169.0 lb

## 2021-05-12 DIAGNOSIS — Z3009 Encounter for other general counseling and advice on contraception: Secondary | ICD-10-CM | POA: Diagnosis not present

## 2021-05-12 DIAGNOSIS — N898 Other specified noninflammatory disorders of vagina: Secondary | ICD-10-CM

## 2021-05-12 DIAGNOSIS — N912 Amenorrhea, unspecified: Secondary | ICD-10-CM | POA: Diagnosis not present

## 2021-05-12 LAB — POCT URINE PREGNANCY: Preg Test, Ur: NEGATIVE

## 2021-05-12 MED ORDER — NORETHIN ACE-ETH ESTRAD-FE 1-20 MG-MCG PO TABS: 1.0000 | ORAL_TABLET | Freq: Every day | ORAL | 3 refills | Status: DC

## 2021-05-12 NOTE — Progress Notes (Signed)
Pt present to discuss birth control. Pt stated that she would like to get OCP or the patches for birth control. UPT-NEG. LMP unknown due to having an abortion.

## 2021-05-12 NOTE — Progress Notes (Signed)
   GYNECOLOGY CLINIC PROGRESS NOTE Subjective:    Wanda Richmond is a 30 y.o. [phone] female who presents for contraception management. Thinks she would like to initiate OCPs. The patient is sexually active. Pertinent past medical history: none.  Of note, patient recently had termination of pregnancy ~ 2 months ago.  Has not resumed her menstrual cycle as of yet.    The patient has the following  complaints today.  Reports vaginal odor.  Had h/o BV infection ~ 2 months ago. Thinks she may have another one.  Notes that they seem to be occurring after intercourse.    Menstrual History: OB History  Gravida Para Term Preterm AB Living  4 2 2   2 2   SAB IAB Ectopic Multiple Live Births  1     0 2    # Outcome Date GA Lbr Len/2nd Weight Sex Delivery Anes PTL Lv  4 AB 2022          3 Term 09/18/20 [redacted]w[redacted]d 00:25 / 00:11 7 lb 1.6 oz (3.22 kg) M Vag-Spont EPI  LIV  2 SAB 2015          1 Term 2013 [redacted]w[redacted]d  8 lb 2 oz (3.685 kg) F Vag-Spont  Y LIV    Menarche age: 20 or 38 No LMP recorded (lmp unknown).    The following portions of the patient's history were reviewed and updated as appropriate: allergies, current medications, past family history, past medical history, past social history, past surgical history, and problem list.  Review of Systems Pertinent items noted in HPI and remainder of comprehensive ROS otherwise negative.   Objective:    General appearance: alert and no distress Abdomen: soft, non-tender; bowel sounds normal; no masses,  no organomegaly Pelvic: external genitalia normal, rectovaginal septum normal.  Vagina with small amount of thin yellow-white discharge.  Cervix normal appearing, no lesions and no motion tenderness.  Uterus mobile, nontender, normal shape and size.  Adnexae non-palpable, nontender bilaterally.  Extremities: extremities normal, atraumatic, no cyanosis or edema Neurologic: Grossly normal    Labs:  Microscopic wet-mount exam shows few clue cells, no  hyphae, no trichomonads, numerous white blood cells. KOH done.    Results for orders placed or performed in visit on 05/12/21  POCT urine pregnancy  Result Value Ref Range   Preg Test, Ur Negative Negative    Assessment:    30 y.o., starting OCP (estrogen/progesterone), no contraindications.   Vaginal odor  Amenorrhea  Plan:   - Contraception: OCP (estrogen/progesterone).   Discussed immediate vs Sunday start. Also advised on use of back-up method for at least 1-2 weeks after initiation of pills.  Discussed options for back-methods, patient desires to try Phexxi. Samples given in the office.  - Vaginal odor: No obvious infection noted on wet mount, however given patient's symptoms and history offered prophylactic treatment of BV.  Given sample of Nuvessa.  Discussed vaginal hygiene protocol.  If still recurrent episodes noted, can treat with long-term prophylactic BV regimen.  - Amenorrhea likely due to recent pregnancy with termination. Should resume once OCPs are initiated.  - Return to clinic for any scheduled appointments or for any gynecologic concerns as needed.     Thursday, MD [facility]

## 2021-05-15 LAB — NUSWAB VAGINITIS PLUS (VG+)
Candida albicans, NAA: NEGATIVE
Candida glabrata, NAA: NEGATIVE
Chlamydia trachomatis, NAA: NEGATIVE
Megasphaera 1: HIGH Score — AB
Neisseria gonorrhoeae, NAA: NEGATIVE
Trich vag by NAA: NEGATIVE

## 2021-05-17 ENCOUNTER — Telehealth: Payer: Self-pay | Admitting: Obstetrics and Gynecology

## 2021-05-17 MED ORDER — METRONIDAZOLE 500 MG PO TABS: 500.0000 mg | ORAL_TABLET | Freq: Two times a day (BID) | ORAL | 0 refills | Status: DC

## 2021-05-17 NOTE — Telephone Encounter (Signed)
Patient is requesting a call back - needs recent lab results explained. Please Advise.

## 2021-05-18 NOTE — Telephone Encounter (Signed)
Please see my chart messages

## 2021-05-21 DIAGNOSIS — Z419 Encounter for procedure for purposes other than remedying health state, unspecified: Secondary | ICD-10-CM | POA: Diagnosis not present

## 2021-06-21 DIAGNOSIS — Z419 Encounter for procedure for purposes other than remedying health state, unspecified: Secondary | ICD-10-CM | POA: Diagnosis not present

## 2021-07-22 DIAGNOSIS — Z419 Encounter for procedure for purposes other than remedying health state, unspecified: Secondary | ICD-10-CM | POA: Diagnosis not present

## 2021-08-21 DIAGNOSIS — Z419 Encounter for procedure for purposes other than remedying health state, unspecified: Secondary | ICD-10-CM | POA: Diagnosis not present

## 2021-09-02 NOTE — Progress Notes (Signed)
OBSTETRIC INITIAL PRENATAL CARE NOTE  Subjective:    Wanda Richmond is a 30 y.o. [phone] female who was scheduled for an annual exam, however review of chart notes physical was done in April.    The patient has the following complaints today. 1.She took a home pregnancy test and it was positive. This pregnancy is unplanned, she started OCP back in May. She missed some doses and now she is pregnant.  Patient's last menstrual period was 06/22/2021 (exact date).  Gynecologic History:  Menarche age: 71 Patient's last menstrual period was 06/22/2021 (exact date). Contraception: none History of STI's: Hx of HSV Last Pap: 04/07/2020. Results were: normal.  Denies h/o abnormal pap smears. Last mammogram: Not age appropriate   OB History  Gravida Para Term Preterm AB Living  4 2 2  0 2 2  SAB IAB Ectopic Multiple Live Births  1 0 0 0 2    # Outcome Date GA Lbr Len/2nd Weight Sex Delivery Anes PTL Lv  4 AB 2022          3 Term 09/18/20 [redacted]w[redacted]d 00:25 / 00:11 7 lb 1.6 oz (3.22 kg) M Vag-Spont EPI  LIV     Name: Hankins,BOY Gionna     Apgar1: 7  Apgar5: 9  2 SAB 2015          1 Term 2013 [redacted]w[redacted]d  8 lb 2 oz (3.685 kg) F Vag-Spont  Y LIV    Past Medical History:  Diagnosis Date   Anemia    History of herpes genitalis 09/18/2020    Past Surgical History:  Procedure Laterality Date   WISDOM TOOTH EXTRACTION      Family History  Problem Relation Age of Onset   Healthy Mother    Healthy Father    Breast cancer Paternal Grandmother     Social History   Socioeconomic History   Marital status: Married    Spouse name: 09/20/2020   Number of children: Not on file   Years of education: Not on file   Highest education level: Not on file  Occupational History   Not on file  Tobacco Use   Smoking status: Never   Smokeless tobacco: Never  Vaping Use   Vaping Use: Never used  Substance and Sexual Activity   Alcohol use: Yes   Drug use: Never   Sexual activity: Yes    Birth  control/protection: None  Other Topics Concern   Not on file  Social History Narrative   Not on file   Social Determinants of Health   Financial Resource Strain: Not on file  Food Insecurity: Not on file  Transportation Needs: Not on file  Physical Activity: Not on file  Stress: Not on file  Social Connections: Not on file  Intimate Partner Violence: Not on file    Current Outpatient Medications on File Prior to Visit  Medication Sig Dispense Refill   metroNIDAZOLE (FLAGYL) 500 MG tablet Take 1 tablet (500 mg total) by mouth 2 (two) times daily. 14 tablet 0   norethindrone-ethinyl estradiol-FE (JUNEL FE 1/20) 1-20 MG-MCG tablet Take 1 tablet by mouth daily. 84 tablet 3   No current facility-administered medications on file prior to visit.    Allergies  Allergen Reactions   Penicillins Shortness Of Breath and Cough     Review of Systems Constitutional: negative for chills, fatigue, fevers and sweats Eyes: negative for irritation, redness and visual disturbance Ears, nose, mouth, throat, and face: negative for hearing loss, nasal congestion, snoring  and tinnitus Respiratory: negative for asthma, cough, sputum Cardiovascular: negative for chest pain, dyspnea, exertional chest pressure/discomfort, irregular heart beat, palpitations and syncope Gastrointestinal: negative for abdominal pain, change in bowel habits, nausea and vomiting Genitourinary: negative for abnormal menstrual periods, genital lesions, sexual problems and vaginal discharge, dysuria and urinary incontinence Integument/breast: negative for breast lump, breast tenderness and nipple discharge Hematologic/lymphatic: negative for bleeding and easy bruising Musculoskeletal:negative for back pain and muscle weakness Neurological: negative for dizziness, headaches, vertigo and weakness Endocrine: negative for diabetic symptoms including polydipsia, polyuria and skin dryness Allergic/Immunologic: negative for hay  fever and urticaria      Objective:  Blood pressure 105/66, pulse 91, resp. rate 16, height 5\' 6"  (1.676 m), weight 170 lb 8 oz (77.3 kg), last menstrual period 06/22/2021, not currently breastfeeding. Body mass index is 27.52 kg/m.   General Appearance:    Alert, cooperative, no distress, appears stated age, overweight  Head:    Normocephalic, without obvious abnormality, atraumatic  Eyes:    PERRL, conjunctiva/corneas clear, EOM's intact, both eyes  Ears:    Normal external ear canals, both ears  Nose:   Nares normal, septum midline, mucosa normal, no drainage or sinus tenderness  Throat:   Lips, mucosa, and tongue normal; teeth and gums normal  Neck:   Supple, symmetrical, trachea midline, no adenopathy; thyroid: no enlargement/tenderness/nodules; no carotid bruit or JVD  Back:     Symmetric, no curvature, ROM normal, no CVA tenderness  Lungs:     Clear to auscultation bilaterally, respirations unlabored  Chest Wall:    No tenderness or deformity   Heart:    Regular rate and rhythm, S1 and S2 normal, no murmur, rub or gallop  Breast Exam:    No tenderness, masses, or nipple abnormality  Abdomen:     Soft, non-tender, bowel sounds active all four quadrants, no masses, no organomegaly.    Genitalia:    Pelvic:external genitalia normal, vagina without lesions, discharge, or tenderness, rectovaginal septum  normal. Cervix normal in appearance, no cervical motion tenderness, no adnexal masses or tenderness.  Uterus normal size, shape, mobile, regular contours, nontender.  Rectal:    Normal external sphincter.  No hemorrhoids appreciated. Internal exam not done.   Extremities:   Extremities normal, atraumatic, no cyanosis or edema  Pulses:   2+ and symmetric all extremities  Skin:   Skin color, texture, turgor normal, no rashes or lesions  Lymph nodes:   Cervical, supraclavicular, and axillary nodes normal  Neurologic:   CNII-XII intact, normal strength, sensation and reflexes throughout    .  Labs:  Lab Results  Component Value Date   WBC 6.3 03/09/2021   HGB 12.3 03/09/2021   HCT 38.7 03/09/2021   MCV 97 03/09/2021   PLT 285 03/09/2021    Lab Results  Component Value Date   CREATININE 0.61 03/09/2021   BUN 14 03/09/2021   NA 139 03/09/2021   K 4.0 03/09/2021   CL 103 03/09/2021   CO2 21 03/09/2021    Lab Results  Component Value Date   ALT 13 03/09/2021   AST 11 03/09/2021   ALKPHOS 58 03/09/2021   BILITOT <0.2 03/09/2021    No results found for: TSH   Results for orders placed or performed in visit on 09/03/21  POCT urine pregnancy  Result Value Ref Range   Preg Test, Ur Positive (A) Negative    Assessment:   1. Pregnancy confirmed by positive urine test   2. Well woman exam with  routine gynecological exam   3. History of herpes genitalis      Plan:  Pregnancy, confirmed today, currently ~ 10.3 weeks by LMP.  FHT present, 172 bpm. Will establish care for this pregnancy. Already taking PNV and iron supplement. RTC in 1 week for NOB intake and dating scan.  Blood tests: WiIl perform at NOB intake.  Contraception: none.  Has been inconsistent with OCPs. Was also recently pregnant with termination of pregnancy in April of this year.  Discussed healthy lifestyle modifications. Pap smear  UTD . COVID vaccination status:UTD History of HSV, will need prophylaxis at 36 weeks. No history of recent outbreaks.  Follow up in 4 weeks for next OB visit.    Hildred Laser, MD [facility]

## 2021-09-02 NOTE — Patient Instructions (Addendum)
Common Medications Safe in Pregnancy  Acne:      Constipation:  Benzoyl Peroxide     Colace  Clindamycin      Dulcolax Suppository  Topica Erythromycin     Fibercon  Salicylic Acid      Metamucil         Miralax AVOID:        Senakot   Accutane    Cough:  Retin-A       Cough Drops  Tetracycline      Phenergan w/ Codeine if Rx  Minocycline      Robitussin (Plain & DM)  Antibiotics:     Crabs/Lice:  Ceclor       RID  Cephalosporins    AVOID:  E-Mycins      Kwell  Keflex  Macrobid/Macrodantin   Diarrhea:  Penicillin      Kao-Pectate  Zithromax      Imodium AD         PUSH FLUIDS AVOID:       Cipro     Fever:  Tetracycline      Tylenol (Regular or Extra  Minocycline       Strength)  Levaquin      Extra Strength-Do not          Exceed 8 tabs/24 hrs Caffeine:        <200mg/day (equiv. To 1 cup of coffee or  approx. 3 12 oz sodas)         Gas: Cold/Hayfever:       Gas-X  Benadryl      Mylicon  Claritin       Phazyme  **Claritin-D        Chlor-Trimeton    Headaches:  Dimetapp      ASA-Free Excedrin  Drixoral-Non-Drowsy     Cold Compress  Mucinex (Guaifenasin)     Tylenol (Regular or Extra  Sudafed/Sudafed-12 Hour     Strength)  **Sudafed PE Pseudoephedrine   Tylenol Cold & Sinus     Vicks Vapor Rub  Zyrtec  **AVOID if Problems With Blood Pressure         Heartburn: Avoid lying down for at least 1 hour after meals  Aciphex      Maalox     Rash:  Milk of Magnesia     Benadryl    Mylanta       1% Hydrocortisone Cream  Pepcid  Pepcid Complete   Sleep Aids:  Prevacid      Ambien   Prilosec       Benadryl  Rolaids       Chamomile Tea  Tums (Limit 4/day)     Unisom         Tylenol PM         Warm milk-add vanilla or  Hemorrhoids:       Sugar for taste  Anusol/Anusol H.C.  (RX: Analapram 2.5%)  Sugar Substitutes:  Hydrocortisone OTC     Ok in moderation  Preparation H      Tucks        Vaseline lotion applied to tissue with  wiping    Herpes:     Throat:  Acyclovir      Oragel  Famvir  Valtrex     Vaccines:         Flu Shot Leg Cramps:       *Gardasil  Benadryl      Hepatitis A         Hepatitis B Nasal Spray:         Pneumovax  Saline Nasal Spray     Polio Booster         Tetanus Nausea:       Tuberculosis test or PPD  Vitamin B6 25 mg TID   AVOID:    Dramamine      *Gardasil  Emetrol       Live Poliovirus  Ginger Root 250 mg QID    MMR (measles, mumps &  High Complex Carbs @ Bedtime    rebella)  Sea Bands-Accupressure    Varicella (Chickenpox)  Unisom 1/2 tab TID     *No known complications           If received before Pain:         Known pregnancy;   Darvocet       Resume series after  Lortab        Delivery  Percocet    Yeast:   Tramadol      Femstat  Tylenol 3      Gyne-lotrimin  Ultram       Monistat  Vicodin           MISC:         All Sunscreens           Hair Coloring/highlights          Insect Repellant's          (Including DEET)         Mystic Tans    Preventive Care 58-10 Years Old, Female Preventive care refers to lifestyle choices and visits with your health care provider that can promote health and wellness. This includes: A yearly physical exam. This is also called an annual wellness visit. Regular dental and eye exams. Immunizations. Screening for certain conditions. Healthy lifestyle choices, such as: Eating a healthy diet. Getting regular exercise. Not using drugs or products that contain nicotine and tobacco. Limiting alcohol use. What can I expect for my preventive care visit? Physical exam Your health care provider may check your: Height and weight. These may be used to calculate your BMI (body mass index). BMI is a measurement that tells if you are at a healthy weight. Heart rate and blood pressure. Body temperature. Skin for abnormal spots. Counseling Your health care provider may ask you questions about your: Past medical problems. Family's medical  history. Alcohol, tobacco, and drug use. Emotional well-being. Home life and relationship well-being. Sexual activity. Diet, exercise, and sleep habits. Work and work Statistician. Access to firearms. Method of birth control. Menstrual cycle. Pregnancy history. What immunizations do I need? Vaccines are usually given at various ages, according to a schedule. Your health care provider will recommend vaccines for you based on your age, medical history, and lifestyle or other factors, such as travel or where you work. What tests do I need? Blood tests Lipid and cholesterol levels. These may be checked every 5 years starting at age 18. Hepatitis C test. Hepatitis B test. Screening Diabetes screening. This is done by checking your blood sugar (glucose) after you have not eaten for a while (fasting). STD (sexually transmitted disease) testing, if you are at risk. BRCA-related cancer screening. This may be done if you have a family history of breast, ovarian, tubal, or peritoneal cancers. Pelvic exam and Pap test. This may be done every 3 years starting at age 44. Starting at age 12, this may be done every 5 years if you have a Pap test in combination with an HPV test. Talk with your health care  provider about your test results, treatment options, and if necessary, the need for more tests. Follow these instructions at home: Eating and drinking  Eat a healthy diet that includes fresh fruits and vegetables, whole grains, lean protein, and low-fat dairy products. Take vitamin and mineral supplements as recommended by your health care provider. Do not drink alcohol if: Your health care provider tells you not to drink. You are pregnant, may be pregnant, or are planning to become pregnant. If you drink alcohol: Limit how much you have to 0-1 drink a day. Be aware of how much alcohol is in your drink. In the [place] one drink equals one 12 oz bottle of beer (355 mL), one 5 oz glass of wine (148 mL),  or one 1 oz glass of hard liquor (44 mL). Lifestyle Take daily care of your teeth and gums. Brush your teeth every morning and night with fluoride toothpaste. Floss one time each day. Stay active. Exercise for at least 30 minutes 5 or more days each week. Do not use any products that contain nicotine or tobacco, such as cigarettes, e-cigarettes, and chewing tobacco. If you need help quitting, ask your health care provider. Do not use drugs. If you are sexually active, practice safe sex. Use a condom or other form of protection to prevent STIs (sexually transmitted infections). If you do not wish to become pregnant, use a form of birth control. If you plan to become pregnant, see your health care provider for a prepregnancy visit. Find healthy ways to cope with stress, such as: Meditation, yoga, or listening to music. Journaling. Talking to a trusted person. Spending time with friends and family. Safety Always wear your seat belt while driving or riding in a vehicle. Do not drive: If you have been drinking alcohol. Do not ride with someone who has been drinking. When you are tired or distracted. While texting. Wear a helmet and other protective equipment during sports activities. If you have firearms in your house, make sure you follow all gun safety procedures. Seek help if you have been physically or sexually abused. What's next? Go to your health care provider once a year for an annual wellness visit. Ask your health care provider how often you should have your eyes and teeth checked. Stay up to date on all vaccines. This information is not intended to replace advice given to you by your health care provider. Make sure you discuss any questions you have with your health care provider. Document Revised: 01/15/2021 Document Reviewed: 07/19/2018 Elsevier Patient Education  2022 [employer].

## 2021-09-03 ENCOUNTER — Encounter: Payer: Self-pay | Admitting: Obstetrics and Gynecology

## 2021-09-03 ENCOUNTER — Ambulatory Visit (INDEPENDENT_AMBULATORY_CARE_PROVIDER_SITE_OTHER): Payer: Medicaid Other | Admitting: Obstetrics and Gynecology

## 2021-09-03 ENCOUNTER — Other Ambulatory Visit: Payer: Self-pay

## 2021-09-03 VITALS — BP 105/66 | HR 91 | Resp 16 | Ht 66.0 in | Wt 170.5 lb

## 2021-09-03 DIAGNOSIS — Z3200 Encounter for pregnancy test, result unknown: Secondary | ICD-10-CM

## 2021-09-03 DIAGNOSIS — Z01419 Encounter for gynecological examination (general) (routine) without abnormal findings: Secondary | ICD-10-CM

## 2021-09-03 DIAGNOSIS — Z8619 Personal history of other infectious and parasitic diseases: Secondary | ICD-10-CM

## 2021-09-03 DIAGNOSIS — Z3201 Encounter for pregnancy test, result positive: Secondary | ICD-10-CM

## 2021-09-03 LAB — POCT URINE PREGNANCY: Preg Test, Ur: POSITIVE — AB

## 2021-09-10 ENCOUNTER — Other Ambulatory Visit: Payer: Self-pay

## 2021-09-10 ENCOUNTER — Ambulatory Visit (INDEPENDENT_AMBULATORY_CARE_PROVIDER_SITE_OTHER): Payer: Medicaid Other | Admitting: Obstetrics and Gynecology

## 2021-09-10 VITALS — BP 116/72 | HR 85 | Resp 16 | Ht 66.0 in | Wt 170.7 lb

## 2021-09-10 DIAGNOSIS — Z3481 Encounter for supervision of other normal pregnancy, first trimester: Secondary | ICD-10-CM

## 2021-09-10 DIAGNOSIS — Z3A11 11 weeks gestation of pregnancy: Secondary | ICD-10-CM | POA: Diagnosis not present

## 2021-09-10 DIAGNOSIS — Z113 Encounter for screening for infections with a predominantly sexual mode of transmission: Secondary | ICD-10-CM | POA: Diagnosis not present

## 2021-09-10 NOTE — Progress Notes (Signed)
Wanda Richmond presents for NOB nurse interview visit. Pregnancy confirmation done 09/03/2021.  [id number]   . Pregnancy education material explained and given. No cats in the home. NOB labs ordered. Sickle cell ordered.  HIV labs and Drug screen were explained optional and she did not decline. Drug screen ordered. PNV encouraged. Genetic screening options discussed. Genetic testing: Ordered. Ok to reveal results to patient.  Pt may discuss with provider.  Financial policy not applicable. FMLA forms reviewed and signed. Pt. To follow up with provider in 3 weeks for NOB physical with Dr. Valentino Saxon.  All questions answered.

## 2021-09-11 LAB — URINALYSIS, ROUTINE W REFLEX MICROSCOPIC
Bilirubin, UA: NEGATIVE
Glucose, UA: NEGATIVE
Ketones, UA: NEGATIVE
Leukocytes,UA: NEGATIVE
Nitrite, UA: NEGATIVE
Protein,UA: NEGATIVE
RBC, UA: NEGATIVE
Specific Gravity, UA: 1.023 (ref 1.005–1.030)
Urobilinogen, Ur: 0.2 mg/dL (ref 0.2–1.0)
pH, UA: 7.5 (ref 5.0–7.5)

## 2021-09-12 LAB — GC/CHLAMYDIA PROBE AMP
Chlamydia trachomatis, NAA: NEGATIVE
Neisseria Gonorrhoeae by PCR: NEGATIVE

## 2021-09-14 LAB — VIRAL HEPATITIS HBV, HCV
HCV Ab: <0.1 s/co ratio (ref 0.0–0.9)
Hep B Core Total Ab: NEGATIVE
Hep B Surface Ab, Qual: NONREACTIVE
Hepatitis B Surface Ag: NEGATIVE

## 2021-09-14 LAB — PARVOVIRUS B19 ANTIBODY, IGG AND IGM
Parvovirus B19 IgG: 4 index — ABNORMAL HIGH (ref 0.0–0.8)
Parvovirus B19 IgM: 1 index — ABNORMAL HIGH (ref 0.0–0.8)

## 2021-09-14 LAB — ABO AND RH: Rh Factor: POSITIVE

## 2021-09-14 LAB — HIV ANTIBODY (ROUTINE TESTING W REFLEX): HIV Screen 4th Generation wRfx: NONREACTIVE

## 2021-09-14 LAB — RPR: RPR Ser Ql: NONREACTIVE

## 2021-09-14 LAB — VARICELLA ZOSTER ANTIBODY, IGG: Varicella zoster IgG: 816 index (ref >165)

## 2021-09-14 LAB — ANTIBODY SCREEN: Antibody Screen: NEGATIVE

## 2021-09-14 LAB — RUBELLA SCREEN: Rubella Antibodies, IGG: 2.91 index (ref >0.99)

## 2021-09-14 LAB — HGB SOLU + RFLX FRAC: Sickle Solubility Test - HGBRFX: NEGATIVE

## 2021-09-14 LAB — HCV INTERPRETATION

## 2021-09-15 ENCOUNTER — Other Ambulatory Visit: Payer: Self-pay

## 2021-09-15 MED ORDER — METRONIDAZOLE 500 MG PO TABS: 500.0000 mg | ORAL_TABLET | Freq: Two times a day (BID) | ORAL | 0 refills | Status: DC

## 2021-09-16 LAB — PAIN MGT SCRN (14 DRUGS), UR

## 2021-09-16 LAB — NICOTINE SCREEN, URINE

## 2021-09-21 DIAGNOSIS — Z419 Encounter for procedure for purposes other than remedying health state, unspecified: Secondary | ICD-10-CM | POA: Diagnosis not present

## 2021-09-23 ENCOUNTER — Other Ambulatory Visit: Payer: Self-pay | Admitting: Obstetrics and Gynecology

## 2021-09-23 LAB — URINE CULTURE, OB REFLEX

## 2021-09-23 LAB — CULTURE, OB URINE

## 2021-09-23 LAB — URINE CULTURE

## 2021-09-23 MED ORDER — NITROFURANTOIN MONOHYD MACRO 100 MG PO CAPS: 100.0000 mg | ORAL_CAPSULE | Freq: Two times a day (BID) | ORAL | 0 refills | Status: DC

## 2021-09-29 ENCOUNTER — Ambulatory Visit (INDEPENDENT_AMBULATORY_CARE_PROVIDER_SITE_OTHER): Payer: Medicaid Other

## 2021-09-29 ENCOUNTER — Other Ambulatory Visit: Payer: Self-pay

## 2021-09-29 DIAGNOSIS — N912 Amenorrhea, unspecified: Secondary | ICD-10-CM

## 2021-09-29 DIAGNOSIS — Z331 Pregnant state, incidental: Secondary | ICD-10-CM | POA: Diagnosis not present

## 2021-10-01 ENCOUNTER — Other Ambulatory Visit: Payer: Self-pay

## 2021-10-01 ENCOUNTER — Ambulatory Visit (INDEPENDENT_AMBULATORY_CARE_PROVIDER_SITE_OTHER): Payer: Medicaid Other | Admitting: Obstetrics and Gynecology

## 2021-10-01 ENCOUNTER — Encounter: Payer: Self-pay | Admitting: Obstetrics and Gynecology

## 2021-10-01 VITALS — BP 91/68 | HR 88 | Wt 167.1 lb

## 2021-10-01 DIAGNOSIS — O2341 Unspecified infection of urinary tract in pregnancy, first trimester: Secondary | ICD-10-CM

## 2021-10-01 DIAGNOSIS — O09892 Supervision of other high risk pregnancies, second trimester: Secondary | ICD-10-CM | POA: Insufficient documentation

## 2021-10-01 DIAGNOSIS — Z3A14 14 weeks gestation of pregnancy: Secondary | ICD-10-CM

## 2021-10-01 DIAGNOSIS — B343 Parvovirus infection, unspecified: Secondary | ICD-10-CM | POA: Diagnosis not present

## 2021-10-01 DIAGNOSIS — O98519 Other viral diseases complicating pregnancy, unspecified trimester: Secondary | ICD-10-CM | POA: Insufficient documentation

## 2021-10-01 DIAGNOSIS — Z23 Encounter for immunization: Secondary | ICD-10-CM | POA: Diagnosis not present

## 2021-10-01 DIAGNOSIS — Z3482 Encounter for supervision of other normal pregnancy, second trimester: Secondary | ICD-10-CM | POA: Diagnosis not present

## 2021-10-01 LAB — POCT URINALYSIS DIPSTICK OB
Bilirubin, UA: NEGATIVE
Blood, UA: NEGATIVE
Glucose, UA: NEGATIVE
Ketones, UA: NEGATIVE
Leukocytes, UA: NEGATIVE
Nitrite, UA: NEGATIVE
Spec Grav, UA: 1.01 (ref 1.010–1.025)
Urobilinogen, UA: 0.2 E.U./dL
pH, UA: 6.5 (ref 5.0–8.0)

## 2021-10-01 NOTE — Patient Instructions (Signed)
WHAT OB PATIENTS CAN EXPECT  Confirmation of pregnancy and ultrasound ordered if medically indicated-[redacted] weeks gestation New OB (NOB) intake with nurse and New OB (NOB) labs- [redacted] weeks gestation New OB (NOB) physical examination with provider- 11/[redacted] weeks gestation Flu vaccine-[redacted] weeks gestation Anatomy scan-[redacted] weeks gestation Glucose tolerance test, blood work to test for anemia, T-dap vaccine-[redacted] weeks gestation Vaginal swabs/cultures-STD/Group B strep-[redacted] weeks gestation Appointments every 4 weeks until 28 weeks Every 2 weeks from 28 weeks until 36 weeks Weekly visits from 36 weeks until delivery   Second Trimester of Pregnancy The second trimester of pregnancy is from week 13 through week 27. This is months 4 through 6 of pregnancy. The second trimester is often a time when you feel your best. Your body has adjusted to being pregnant, and you begin to feel better physically. During the second trimester: Morning sickness has lessened or stopped completely. You may have more energy. You may have an increase in appetite. The second trimester is also a time when the unborn baby (fetus) is growing rapidly. At the end of the sixth month, the fetus may be up to 12 inches long and weigh about 1 pounds. You will likely begin to feel the baby move (quickening) between 16 and 20 weeks of pregnancy. Body changes during your second trimester Your body continues to go through many changes during your second trimester. The changes vary and generally return to normal after the baby is born. Physical changes Your weight will continue to increase. You will notice your lower abdomen bulging out. You may begin to get stretch marks on your hips, abdomen, and breasts. Your breasts will continue to grow and to become tender. Dark spots or blotches (chloasma or mask of pregnancy) may develop on your face. A dark line from your belly button to the pubic area (linea nigra) may appear. You may have changes in your  hair. These can include thickening of your hair, rapid growth, and changes in texture. Some people also have hair loss during or after pregnancy, or hair that feels dry or thin. Health changes You may develop headaches. You may have heartburn. You may develop constipation. You may develop hemorrhoids or swollen, bulging veins (varicose veins). Your gums may bleed and may be sensitive to brushing and flossing. You may urinate more often because the fetus is pressing on your bladder. You may have back pain. This is caused by: Weight gain. Pregnancy hormones that are relaxing the joints in your pelvis. A shift in weight and the muscles that support your balance. Follow these instructions at home: Medicines Follow your health care provider's instructions regarding medicine use. Specific medicines may be either safe or unsafe to take during pregnancy. Do not take any medicines unless approved by your health care provider. Take a prenatal vitamin that contains at least 600 micrograms (mcg) of folic acid. Eating and drinking Eat a healthy diet that includes fresh fruits and vegetables, whole grains, good sources of protein such as meat, eggs, or tofu, and low-fat dairy products. Avoid raw meat and unpasteurized juice, milk, and cheese. These carry germs that can harm you and your baby. You may need to take these actions to prevent or treat constipation: Drink enough fluid to keep your urine pale yellow. Eat foods that are high in fiber, such as beans, whole grains, and fresh fruits and vegetables. Limit foods that are high in fat and processed sugars, such as fried or sweet foods. Activity Exercise only as directed by your health  care provider. Most people can continue their usual exercise routine during pregnancy. Try to exercise for 30 minutes at least 5 days a week. Stop exercising if you develop contractions in your uterus. Stop exercising if you develop pain or cramping in the lower abdomen or  lower back. Avoid exercising if it is very hot or humid or if you are at a high altitude. Avoid heavy lifting. If you choose to, you may have sex unless your health care provider tells you not to. Relieving pain and discomfort Wear a supportive bra to prevent discomfort from breast tenderness. Take warm sitz baths to soothe any pain or discomfort caused by hemorrhoids. Use hemorrhoid cream if your health care provider approves. Rest with your legs raised (elevated) if you have leg cramps or low back pain. If you develop varicose veins: Wear support hose as told by your health care provider. Elevate your feet for 15 minutes, 3-4 times a day. Limit salt in your diet. Safety Wear your seat belt at all times when driving or riding in a car. Talk with your health care provider if someone is verbally or physically abusive to you. Lifestyle Do not use hot tubs, steam rooms, or saunas. Do not douche. Do not use tampons or scented sanitary pads. Avoid cat litter boxes and soil used by cats. These carry germs that can cause birth defects in the baby and possibly loss of the fetus by miscarriage or stillbirth. Do not use herbal remedies, alcohol, illegal drugs, or medicines that are not approved by your health care provider. Chemicals in these products can harm your baby. Do not use any products that contain nicotine or tobacco, such as cigarettes, e-cigarettes, and chewing tobacco. If you need help quitting, ask your health care provider. General instructions During a routine prenatal visit, your health care provider will do a physical exam and other tests. He or she will also discuss your overall health. Keep all follow-up visits. This is important. Ask your health care provider for a referral to a local prenatal education class. Ask for help if you have counseling or nutritional needs during pregnancy. Your health care provider can offer advice or refer you to specialists for help with various  needs. Where to find more information American Pregnancy Association: americanpregnancy.[place] and Gynecologists: PoolDevices.com.pt Office on [employer]: KeywordPortfolios.com.br Contact a health care provider if you have: A headache that does not go away when you take medicine. Vision changes or you see spots in front of your eyes. Mild pelvic cramps, pelvic pressure, or nagging pain in the abdominal area. Persistent nausea, vomiting, or diarrhea. A bad-smelling vaginal discharge or foul-smelling urine. Pain when you urinate. Sudden or extreme swelling of your face, hands, ankles, feet, or legs. A fever. Get help right away if you: Have fluid leaking from your vagina. Have spotting or bleeding from your vagina. Have severe abdominal cramping or pain. Have difficulty breathing. Have chest pain. Have fainting spells. Have not felt your baby move for the time period told by your health care provider. Have new or increased pain, swelling, or redness in an arm or leg. Summary The second trimester of pregnancy is from week 13 through week 27 (months 4 through 6). Do not use herbal remedies, alcohol, illegal drugs, or medicines that are not approved by your health care provider. Chemicals in these products can harm your baby. Exercise only as directed by your health care provider. Most people can continue their usual exercise routine during pregnancy.  Keep all follow-up visits. This is important. This information is not intended to replace advice given to you by your health care provider. Make sure you discuss any questions you have with your health care provider. Document Revised: 04/15/2020 Document Reviewed: 02/20/2020 Elsevier Patient Education  2022 Elsevier Inc.    Common Medications Safe in Pregnancy  Acne:      Constipation:  Benzoyl Peroxide     Colace  Clindamycin      Dulcolax Suppository  Topica  Erythromycin     Fibercon  Salicylic Acid      Metamucil         Miralax AVOID:        Senakot   Accutane    Cough:  Retin-A       Cough Drops  Tetracycline      Phenergan w/ Codeine if Rx  Minocycline      Robitussin (Plain & DM)  Antibiotics:     Crabs/Lice:  Ceclor       RID  Cephalosporins    AVOID:  E-Mycins      Kwell  Keflex  Macrobid/Macrodantin   Diarrhea:  Penicillin      Kao-Pectate  Zithromax      Imodium AD         PUSH FLUIDS AVOID:       Cipro     Fever:  Tetracycline      Tylenol (Regular or Extra  Minocycline       Strength)  Levaquin      Extra Strength-Do not          Exceed 8 tabs/24 hrs Caffeine:        200mg /day (equiv. To 1 cup of coffee or  approx. 3 12 oz sodas)         Gas: Cold/Hayfever:       Gas-X  Benadryl      Mylicon  Claritin       Phazyme  **Claritin-D        Chlor-Trimeton    Headaches:  Dimetapp      ASA-Free Excedrin  Drixoral-Non-Drowsy     Cold Compress  Mucinex (Guaifenasin)     Tylenol (Regular or Extra  Sudafed/Sudafed-12 Hour     Strength)  **Sudafed PE Pseudoephedrine   Tylenol Cold & Sinus     Vicks Vapor Rub  Zyrtec  **AVOID if Problems With Blood Pressure         Heartburn: Avoid lying down for at least 1 hour after meals  Aciphex      Maalox     Rash:  Milk of Magnesia     Benadryl    Mylanta       1% Hydrocortisone Cream  Pepcid  Pepcid Complete   Sleep Aids:  Prevacid      Ambien   Prilosec       Benadryl  Rolaids       Chamomile Tea  Tums (Limit 4/day)     Unisom         Tylenol PM         Warm milk-add vanilla or  Hemorrhoids:       Sugar for taste  Anusol/Anusol H.C.  (RX: Analapram 2.5%)  Sugar Substitutes:  Hydrocortisone OTC     Ok in moderation  Preparation H      Tucks        Vaseline lotion applied to tissue with wiping    Herpes:     Throat:  Acyclovir  Oragel  Famvir  Valtrex     Vaccines:         Flu Shot Leg Cramps:       *Gardasil  Benadryl      Hepatitis  A         Hepatitis B Nasal Spray:       Pneumovax  Saline Nasal Spray     Polio Booster         Tetanus Nausea:       Tuberculosis test or PPD  Vitamin B6 25 mg TID   AVOID:    Dramamine      *Gardasil  Emetrol       Live Poliovirus  Ginger Root 250 mg QID    MMR (measles, mumps &  High Complex Carbs @ Bedtime    rebella)  Sea Bands-Accupressure    Varicella (Chickenpox)  Unisom 1/2 tab TID     *No known complications           If received before Pain:         Known pregnancy;   Darvocet       Resume series after  Lortab        Delivery  Percocet    Yeast:   Tramadol      Femstat  Tylenol 3      Gyne-lotrimin  Ultram       Monistat  Vicodin           MISC:         All Sunscreens           Hair Coloring/highlights          Insect Repellant's          (Including DEET)         Mystic Tans

## 2021-10-01 NOTE — Progress Notes (Signed)
Initial OB: Recent confirmation of pregnancy performed at routine GYN physical several weeks ago. This is a short interval pregnancy. Patient doing well, complains of pelvic pressure, wonders if this is normal. Has questions about recent parvovirus testing. Discussed concerns. For genetic testing today with [facility].  Just started antibiotics for UTI in pregnancy. Will need repeat Cx next visit. Flu vaccine given today. RTC in 4 weeks, for AFP then.   For anatomy scan in 6 weeks, ordered.   The patient has Medicaid.  [facility] Medicaid Risk Screening Form completed today.

## 2021-10-01 NOTE — Progress Notes (Signed)
NOB Physical: She is doing well today. Just wants to know more about positive parvovirus test.

## 2021-10-02 LAB — CBC
Hematocrit: 37.9 % (ref 34.0–46.6)
Hemoglobin: 12.2 g/dL (ref 11.1–15.9)
MCH: 31 pg (ref 26.6–33.0)
MCHC: 32.2 g/dL (ref 31.5–35.7)
MCV: 96 fL (ref 79–97)
Platelets: 372 10*3/uL (ref 150–450)
RBC: 3.94 x10E6/uL (ref 3.77–5.28)
RDW: 11.8 % (ref 11.7–15.4)
WBC: 7.6 10*3/uL (ref 3.4–10.8)

## 2021-10-21 DIAGNOSIS — Z419 Encounter for procedure for purposes other than remedying health state, unspecified: Secondary | ICD-10-CM | POA: Diagnosis not present

## 2021-10-29 ENCOUNTER — Encounter: Payer: Medicaid Other | Admitting: Obstetrics and Gynecology

## 2021-10-29 ENCOUNTER — Encounter: Payer: Self-pay | Admitting: Obstetrics and Gynecology

## 2021-10-29 ENCOUNTER — Ambulatory Visit (INDEPENDENT_AMBULATORY_CARE_PROVIDER_SITE_OTHER): Payer: Medicaid Other | Admitting: Obstetrics and Gynecology

## 2021-10-29 VITALS — BP 94/62 | HR 81 | Wt 170.2 lb

## 2021-10-29 DIAGNOSIS — Z3482 Encounter for supervision of other normal pregnancy, second trimester: Secondary | ICD-10-CM | POA: Diagnosis not present

## 2021-10-29 DIAGNOSIS — N390 Urinary tract infection, site not specified: Secondary | ICD-10-CM | POA: Diagnosis not present

## 2021-10-29 DIAGNOSIS — Z3A18 18 weeks gestation of pregnancy: Secondary | ICD-10-CM

## 2021-10-29 LAB — POCT URINALYSIS DIPSTICK OB
Bilirubin, UA: NEGATIVE
Blood, UA: NEGATIVE
Glucose, UA: NEGATIVE
Ketones, UA: NEGATIVE
Leukocytes, UA: NEGATIVE
Nitrite, UA: NEGATIVE
POC,PROTEIN,UA: NEGATIVE
Spec Grav, UA: 1.015 (ref 1.010–1.025)
Urobilinogen, UA: 0.2 E.U./dL
pH, UA: 6.5 (ref 5.0–8.0)

## 2021-10-29 NOTE — Progress Notes (Signed)
ROB. Patient states no concerns at this time. Patient reports fetal movement with no pain or pressure.

## 2021-10-29 NOTE — Progress Notes (Signed)
ROB: Denies problems.  Reports fetal movement.  Anatomy ultrasound scheduled.  Urine sent for C&S -follow-up today.  aFP today.

## 2021-11-02 LAB — URINE CULTURE

## 2021-11-03 LAB — AFP, SERUM, OPEN SPINA BIFIDA
AFP MoM: 1.11
AFP Value: 54.1 ng/mL
Gest. Age on Collection Date: 18.4 weeks
Maternal Age At EDD: 28.4 yr
OSBR Risk 1 IN: 10000
Test Results:: NEGATIVE
Weight: 170 [lb_av]

## 2021-11-19 ENCOUNTER — Other Ambulatory Visit: Payer: Medicaid Other

## 2021-11-21 DIAGNOSIS — Z419 Encounter for procedure for purposes other than remedying health state, unspecified: Secondary | ICD-10-CM | POA: Diagnosis not present

## 2021-11-21 NOTE — L&D Delivery Note (Signed)
? ? ?     Delivery Note  ? ?Wanda Richmond is a 31 y.o. [id number] at [redacted]w[redacted]d Estimated Date of Delivery: 03/29/22 ? ?PRE-OPERATIVE DIAGNOSIS:  ?1) [redacted]w[redacted]d pregnancy.  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 ?POST-OPERATIVE DIAGNOSIS:  ?1) [redacted]w[redacted]d pregnancy s/p Vaginal, Spontaneous  ?2) viable female infant ? ?Delivery Type: Vaginal, Spontaneous   ? ?Delivery Anesthesia: Epidural  ?  ?ESTIMATED BLOOD LOSS: 220  ml   ? ?FINDINGS:   ?1) female infant, Apgar scores of 8   at 1 minute and 8   at 5 minutes and a birthweight of   ounces.   ? ?2) Nuchal cord: Yes x1 ? ?SPECIMENS:  ? PLACENTA: ?  Appearance: Intact  ?  Removal: Spontaneous    ?  Disposition:   ? ?DISPOSITION:  ?Infant to left in stable condition in the delivery room, with L&D personnel and mother, ? ?NARRATIVE SUMMARY: ?Labor course:  Ms. [name] is a [id number] at [redacted]w[redacted]d who presented for induction of labor.  She progressed well in labor with one dose of misoprostol.  She received the appropriate anesthesia and proceeded to complete dilation. ?She mildly pushed one time.  ?She went on to deliver a viable infant. ?The placenta delivered without problems and was noted to be complete. ?A perineal and vaginal examination was performed. ?Episiotomy/Lacerations:  small superficial tear not needing repair. ? ? ?Elonda Husky, M.D. ?03/26/2022 ?8:48 AM ? ?

## 2021-11-23 ENCOUNTER — Other Ambulatory Visit: Payer: Self-pay

## 2021-11-23 ENCOUNTER — Ambulatory Visit (INDEPENDENT_AMBULATORY_CARE_PROVIDER_SITE_OTHER): Payer: Medicaid Other

## 2021-11-23 DIAGNOSIS — Z3482 Encounter for supervision of other normal pregnancy, second trimester: Secondary | ICD-10-CM | POA: Diagnosis not present

## 2021-11-25 ENCOUNTER — Other Ambulatory Visit: Payer: Self-pay

## 2021-11-25 ENCOUNTER — Ambulatory Visit (INDEPENDENT_AMBULATORY_CARE_PROVIDER_SITE_OTHER): Payer: Medicaid Other | Admitting: Obstetrics and Gynecology

## 2021-11-25 VITALS — BP 103/60 | Ht 66.0 in | Wt 166.5 lb

## 2021-11-25 DIAGNOSIS — Z3A22 22 weeks gestation of pregnancy: Secondary | ICD-10-CM

## 2021-11-25 DIAGNOSIS — O09892 Supervision of other high risk pregnancies, second trimester: Secondary | ICD-10-CM

## 2021-11-25 DIAGNOSIS — Z3482 Encounter for supervision of other normal pregnancy, second trimester: Secondary | ICD-10-CM

## 2021-11-25 DIAGNOSIS — B343 Parvovirus infection, unspecified: Secondary | ICD-10-CM

## 2021-11-25 DIAGNOSIS — O98519 Other viral diseases complicating pregnancy, unspecified trimester: Secondary | ICD-10-CM

## 2021-11-25 LAB — POCT URINALYSIS DIPSTICK OB: Glucose, UA: NEGATIVE

## 2021-11-25 NOTE — Progress Notes (Deleted)
ROB

## 2021-11-25 NOTE — Progress Notes (Signed)
ROB: Patient doing well, no complaints. 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  In light of current finding and patient's history of parvovirus exposure, will refer to MFM for further monitoring and management.  Patient did have negative genetic screening at the beginning of the pregnancy.  Otherwise had normal anatomy scan.  RTC in 4 weeks.

## 2021-11-26 ENCOUNTER — Encounter: Payer: Medicaid Other | Admitting: Obstetrics and Gynecology

## 2021-12-22 DIAGNOSIS — Z419 Encounter for procedure for purposes other than remedying health state, unspecified: Secondary | ICD-10-CM | POA: Diagnosis not present

## 2021-12-23 ENCOUNTER — Encounter: Payer: Medicaid Other | Admitting: Obstetrics and Gynecology

## 2021-12-23 ENCOUNTER — Other Ambulatory Visit: Payer: Self-pay

## 2021-12-23 DIAGNOSIS — Z3A26 26 weeks gestation of pregnancy: Secondary | ICD-10-CM

## 2021-12-23 DIAGNOSIS — Z3482 Encounter for supervision of other normal pregnancy, second trimester: Secondary | ICD-10-CM

## 2021-12-28 ENCOUNTER — Ambulatory Visit (HOSPITAL_BASED_OUTPATIENT_CLINIC_OR_DEPARTMENT_OTHER): Payer: Medicaid Other | Admitting: Obstetrics and Gynecology

## 2021-12-28 ENCOUNTER — Other Ambulatory Visit: Payer: Self-pay | Admitting: Obstetrics and Gynecology

## 2021-12-28 ENCOUNTER — Ambulatory Visit: Payer: Medicaid Other | Attending: Obstetrics and Gynecology

## 2021-12-28 ENCOUNTER — Other Ambulatory Visit: Payer: Self-pay

## 2021-12-28 DIAGNOSIS — Z880 Allergy status to penicillin: Secondary | ICD-10-CM | POA: Insufficient documentation

## 2021-12-28 DIAGNOSIS — O4392 Unspecified placental disorder, second trimester: Secondary | ICD-10-CM | POA: Insufficient documentation

## 2021-12-28 DIAGNOSIS — O43102 Malformation of placenta, unspecified, second trimester: Secondary | ICD-10-CM

## 2021-12-28 DIAGNOSIS — Z3A27 27 weeks gestation of pregnancy: Secondary | ICD-10-CM | POA: Diagnosis not present

## 2021-12-28 DIAGNOSIS — Z363 Encounter for antenatal screening for malformations: Secondary | ICD-10-CM | POA: Diagnosis not present

## 2021-12-28 NOTE — Progress Notes (Signed)
Maternal-Fetal Medicine   Name: Wanda Richmond DOB: [birth year] MRN: [id number] Referring Provider: Hildred Laser, MD   I had the pleasure of seeing Wanda Richmond today at [facility].  She is here for a second-opinion ultrasound.  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 Prenatal course: On cell-free fetal DNA screening, the risks of fetal aneuploidies are not increased.  MSAFP screening showed low risk for open neural tube defects.  Past medical history: No history of hypertension or diabetes or any chronic medical conditions. Past surgical history: Nil of note. Medications: Prenatal vitamins, iron supplements. Allergies: Penicillin (shortness of breath). Social history: Denies tobacco or drug or alcohol use.  She has been married 3 years and her husband is in good health. Family history: No history of venous thromboembolism in the family. GYN history: No history of abnormal Pap smears or cervical surgeries. Obstetric history significant for 2 term vaginal deliveries.  Her pregnancies and deliveries were uncomplicated, and her children are in good health.  Ultrasound We performed a fetal anatomical survey.  Fetal growth is appropriate for gestational age.  Amniotic fluid is normal and good fetal activity seen.  No markers of aneuploidy's or fetal structural defects are seen. An irregular hypoechoic placental mass is seen on the fetal surface and measures 4.4 x 2.6 x  1.8 cm. Color-Doppler flow showed increased vascularity around the mass and pulsed-Doppler confirmed fetal vessel. No evidence of fetal hydrops. 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  Chorioangioma Chorioangioma is seen in 0.5% to 1% of the placentas. It is a vascular tumor and can be angiomatous or cellular. It is supplied by fetal vessel and the complications is related to its size. Large tumors measuring  greater than 5 cm are rare (1 to 3 per 10,000) and can be associated with fetal growth restriction, hydrops from high-out put failure and placental abruption. Neonatal anemia and thrombocytopenia can occur.  Management is conservative. Laser ablation or local alcohol injection have not shown consistently good outcomes and can lead to fetal death.  I explained the finding and reassured her that the size of the tumor seems to have decreased from the previous ultrasound finding. I reassured her that it is not a malignant tumor.  We will continue to follow-up every 2 weeks and decrease the frequency of surveillance as indicated.  Recommendations -An appointment was made for her to return in 2 weeks for a limited ultrasound evaluation of the placental mass. -Fetal growth assessment in 4 weeks. -Weekly BPP from [redacted] weeks gestation till delivery or earlier if the placental mass seems to increase. -Patient can deliver at [facility].  If there is persistence of chorioangioma, the neonatologist to be informed for delivery because of the possibility of anemia and neonatal thrombocytopenia (should be rare with smaller tumors).  Thank you for consultation.  If you have any questions or concerns, please contact me the [facility].  Consultation including face-to-face counseling (more than 50% of time spent) is 30 minutes.

## 2021-12-30 ENCOUNTER — Other Ambulatory Visit: Payer: Self-pay

## 2021-12-30 ENCOUNTER — Ambulatory Visit (INDEPENDENT_AMBULATORY_CARE_PROVIDER_SITE_OTHER): Payer: Medicaid Other | Admitting: Obstetrics and Gynecology

## 2021-12-30 ENCOUNTER — Other Ambulatory Visit: Payer: Self-pay | Admitting: Obstetrics and Gynecology

## 2021-12-30 ENCOUNTER — Other Ambulatory Visit: Payer: Medicaid Other

## 2021-12-30 VITALS — BP 97/51 | HR 86 | Wt 182.5 lb

## 2021-12-30 DIAGNOSIS — Z23 Encounter for immunization: Secondary | ICD-10-CM

## 2021-12-30 DIAGNOSIS — Z3483 Encounter for supervision of other normal pregnancy, third trimester: Secondary | ICD-10-CM

## 2021-12-30 DIAGNOSIS — Z3A27 27 weeks gestation of pregnancy: Secondary | ICD-10-CM

## 2021-12-30 DIAGNOSIS — Z131 Encounter for screening for diabetes mellitus: Secondary | ICD-10-CM

## 2021-12-30 LAB — POCT URINALYSIS DIPSTICK OB
Bilirubin, UA: NEGATIVE
Blood, UA: NEGATIVE
Glucose, UA: NEGATIVE
Ketones, UA: NEGATIVE
Leukocytes, UA: NEGATIVE
Nitrite, UA: NEGATIVE
Spec Grav, UA: 1.01 (ref 1.010–1.025)
Urobilinogen, UA: 0.2 E.U./dL
pH, UA: 8.5 — AB (ref 5.0–8.0)

## 2021-12-30 NOTE — Progress Notes (Signed)
ROB: Occasional inner thigh pain but not disabling.  Patient being followed for chorioangioma -has another appointment at MFM on the 21st.  Reports daily fetal movement.  1 hour GCT today.

## 2021-12-30 NOTE — Progress Notes (Signed)
ROB. Patient received TDAP injection. Patient states no questions or concerns.

## 2021-12-31 LAB — CBC WITH DIFF/PLATELET
Basophils Absolute: 0 10*3/uL (ref 0.0–0.2)
Basos: 0 %
EOS (ABSOLUTE): 0.1 10*3/uL (ref 0.0–0.4)
Eos: 1 %
Hematocrit: 34.3 % (ref 34.0–46.6)
Hemoglobin: 11.2 g/dL (ref 11.1–15.9)
Immature Grans (Abs): 0.1 10*3/uL (ref 0.0–0.1)
Immature Granulocytes: 1 %
Lymphocytes Absolute: 1.4 10*3/uL (ref 0.7–3.1)
Lymphs: 17 %
MCH: 31.6 pg (ref 26.6–33.0)
MCHC: 32.7 g/dL (ref 31.5–35.7)
MCV: 97 fL (ref 79–97)
Monocytes Absolute: 0.4 10*3/uL (ref 0.1–0.9)
Monocytes: 5 %
Neutrophils Absolute: 6.4 10*3/uL (ref 1.4–7.0)
Neutrophils: 76 %
Platelets: 241 10*3/uL (ref 150–450)
RBC: 3.54 x10E6/uL — ABNORMAL LOW (ref 3.77–5.28)
RDW: 11.8 % (ref 11.7–15.4)
WBC: 8.4 10*3/uL (ref 3.4–10.8)

## 2021-12-31 LAB — GLUCOSE TOLERANCE, 1 HOUR: Glucose, 1Hr PP: 132 mg/dL (ref 70–199)

## 2021-12-31 LAB — RPR: RPR Ser Ql: NONREACTIVE

## 2022-01-09 ENCOUNTER — Other Ambulatory Visit: Payer: Self-pay

## 2022-01-09 DIAGNOSIS — B343 Parvovirus infection, unspecified: Secondary | ICD-10-CM

## 2022-01-09 DIAGNOSIS — O09892 Supervision of other high risk pregnancies, second trimester: Secondary | ICD-10-CM

## 2022-01-09 DIAGNOSIS — Z87440 Personal history of urinary (tract) infections: Secondary | ICD-10-CM

## 2022-01-09 DIAGNOSIS — Z8619 Personal history of other infectious and parasitic diseases: Secondary | ICD-10-CM

## 2022-01-11 ENCOUNTER — Other Ambulatory Visit: Payer: Self-pay

## 2022-01-11 ENCOUNTER — Ambulatory Visit: Payer: Medicaid Other | Attending: Maternal & Fetal Medicine

## 2022-01-11 DIAGNOSIS — B343 Parvovirus infection, unspecified: Secondary | ICD-10-CM | POA: Diagnosis not present

## 2022-01-11 DIAGNOSIS — O98513 Other viral diseases complicating pregnancy, third trimester: Secondary | ICD-10-CM | POA: Diagnosis not present

## 2022-01-11 DIAGNOSIS — O98519 Other viral diseases complicating pregnancy, unspecified trimester: Secondary | ICD-10-CM

## 2022-01-11 DIAGNOSIS — Z8619 Personal history of other infectious and parasitic diseases: Secondary | ICD-10-CM

## 2022-01-11 DIAGNOSIS — Z87440 Personal history of urinary (tract) infections: Secondary | ICD-10-CM | POA: Insufficient documentation

## 2022-01-11 DIAGNOSIS — O09892 Supervision of other high risk pregnancies, second trimester: Secondary | ICD-10-CM

## 2022-01-11 DIAGNOSIS — Z3A29 29 weeks gestation of pregnancy: Secondary | ICD-10-CM | POA: Diagnosis not present

## 2022-01-19 DIAGNOSIS — Z419 Encounter for procedure for purposes other than remedying health state, unspecified: Secondary | ICD-10-CM | POA: Diagnosis not present

## 2022-01-20 ENCOUNTER — Other Ambulatory Visit: Payer: Self-pay

## 2022-01-20 DIAGNOSIS — O43103 Malformation of placenta, unspecified, third trimester: Secondary | ICD-10-CM

## 2022-01-20 DIAGNOSIS — O09892 Supervision of other high risk pregnancies, second trimester: Secondary | ICD-10-CM

## 2022-01-20 DIAGNOSIS — O09893 Supervision of other high risk pregnancies, third trimester: Secondary | ICD-10-CM

## 2022-01-20 DIAGNOSIS — B343 Parvovirus infection, unspecified: Secondary | ICD-10-CM

## 2022-01-21 ENCOUNTER — Encounter: Payer: Self-pay | Admitting: Obstetrics and Gynecology

## 2022-01-21 ENCOUNTER — Other Ambulatory Visit: Payer: Self-pay

## 2022-01-21 ENCOUNTER — Ambulatory Visit (INDEPENDENT_AMBULATORY_CARE_PROVIDER_SITE_OTHER): Payer: Medicaid Other | Admitting: Obstetrics and Gynecology

## 2022-01-21 VITALS — BP 99/61 | HR 89 | Wt 185.7 lb

## 2022-01-21 DIAGNOSIS — Z3483 Encounter for supervision of other normal pregnancy, third trimester: Secondary | ICD-10-CM

## 2022-01-21 DIAGNOSIS — O09892 Supervision of other high risk pregnancies, second trimester: Secondary | ICD-10-CM

## 2022-01-21 DIAGNOSIS — Z3A30 30 weeks gestation of pregnancy: Secondary | ICD-10-CM | POA: Diagnosis not present

## 2022-01-21 LAB — POCT URINALYSIS DIPSTICK OB
Bilirubin, UA: NEGATIVE
Blood, UA: NEGATIVE
Glucose, UA: NEGATIVE
Ketones, UA: NEGATIVE
Leukocytes, UA: NEGATIVE
Nitrite, UA: NEGATIVE
Spec Grav, UA: 1.015 (ref 1.010–1.025)
Urobilinogen, UA: 0.2 E.U./dL
pH, UA: 7 (ref 5.0–8.0)

## 2022-01-21 NOTE — Progress Notes (Signed)
ROB: Doing well, no issues. 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  Next scan due next week (3/7). Normal 28 week labs. RTC in 2 weeks.  ?

## 2022-01-21 NOTE — Progress Notes (Signed)
ROB: She is doing well. She has no new concerns today. 

## 2022-01-25 ENCOUNTER — Ambulatory Visit: Payer: Medicaid Other | Attending: Obstetrics and Gynecology

## 2022-01-25 ENCOUNTER — Other Ambulatory Visit: Payer: Self-pay

## 2022-01-25 DIAGNOSIS — O98519 Other viral diseases complicating pregnancy, unspecified trimester: Secondary | ICD-10-CM

## 2022-01-25 DIAGNOSIS — O98513 Other viral diseases complicating pregnancy, third trimester: Secondary | ICD-10-CM | POA: Insufficient documentation

## 2022-01-25 DIAGNOSIS — Z3A31 31 weeks gestation of pregnancy: Secondary | ICD-10-CM | POA: Diagnosis not present

## 2022-01-25 DIAGNOSIS — O09893 Supervision of other high risk pregnancies, third trimester: Secondary | ICD-10-CM | POA: Insufficient documentation

## 2022-01-25 DIAGNOSIS — B343 Parvovirus infection, unspecified: Secondary | ICD-10-CM | POA: Diagnosis not present

## 2022-01-25 DIAGNOSIS — O43103 Malformation of placenta, unspecified, third trimester: Secondary | ICD-10-CM | POA: Diagnosis not present

## 2022-02-03 ENCOUNTER — Encounter: Payer: Medicaid Other | Admitting: Obstetrics and Gynecology

## 2022-02-03 DIAGNOSIS — Z3A32 32 weeks gestation of pregnancy: Secondary | ICD-10-CM

## 2022-02-03 DIAGNOSIS — Z3483 Encounter for supervision of other normal pregnancy, third trimester: Secondary | ICD-10-CM

## 2022-02-10 ENCOUNTER — Encounter: Payer: Self-pay | Admitting: Obstetrics and Gynecology

## 2022-02-10 ENCOUNTER — Other Ambulatory Visit: Payer: Self-pay

## 2022-02-10 ENCOUNTER — Ambulatory Visit (INDEPENDENT_AMBULATORY_CARE_PROVIDER_SITE_OTHER): Payer: Medicaid Other | Admitting: Obstetrics and Gynecology

## 2022-02-10 VITALS — BP 109/64 | HR 97 | Wt 189.8 lb

## 2022-02-10 DIAGNOSIS — O43103 Malformation of placenta, unspecified, third trimester: Secondary | ICD-10-CM

## 2022-02-10 DIAGNOSIS — O09892 Supervision of other high risk pregnancies, second trimester: Secondary | ICD-10-CM

## 2022-02-10 DIAGNOSIS — Z3483 Encounter for supervision of other normal pregnancy, third trimester: Secondary | ICD-10-CM

## 2022-02-10 DIAGNOSIS — B343 Parvovirus infection, unspecified: Secondary | ICD-10-CM

## 2022-02-10 DIAGNOSIS — Z3A33 33 weeks gestation of pregnancy: Secondary | ICD-10-CM

## 2022-02-10 LAB — POCT URINALYSIS DIPSTICK OB
Bilirubin, UA: NEGATIVE
Blood, UA: NEGATIVE
Glucose, UA: NEGATIVE
Ketones, UA: NEGATIVE
Leukocytes, UA: NEGATIVE
Nitrite, UA: NEGATIVE
Spec Grav, UA: 1.015 (ref 1.010–1.025)
Urobilinogen, UA: 0.2 E.U./dL
pH, UA: 7 (ref 5.0–8.0)

## 2022-02-10 NOTE — Progress Notes (Signed)
ROB: She denies problems.  Reports daily fetal movement.  Occasional pelvic pressure/contractions but nothing regular.  Chorioangioma continues to get smaller by ultrasound.  MFM follow-up as scheduled.  Possibly consider fetal surveillance beginning at 36 weeks. ?

## 2022-02-10 NOTE — Progress Notes (Signed)
ROB. Patient states fetal movement with pressure. She states her MFM appointment went well and she follows up on 3/29. Patient states no questions or concerns at this time.   ?

## 2022-02-15 ENCOUNTER — Other Ambulatory Visit: Payer: Self-pay | Admitting: Obstetrics and Gynecology

## 2022-02-15 ENCOUNTER — Other Ambulatory Visit: Payer: Self-pay

## 2022-02-15 ENCOUNTER — Ambulatory Visit: Payer: Medicaid Other | Attending: Obstetrics and Gynecology

## 2022-02-15 DIAGNOSIS — O09892 Supervision of other high risk pregnancies, second trimester: Secondary | ICD-10-CM

## 2022-02-15 DIAGNOSIS — O98513 Other viral diseases complicating pregnancy, third trimester: Secondary | ICD-10-CM | POA: Diagnosis not present

## 2022-02-15 DIAGNOSIS — O43103 Malformation of placenta, unspecified, third trimester: Secondary | ICD-10-CM | POA: Diagnosis not present

## 2022-02-15 DIAGNOSIS — B343 Parvovirus infection, unspecified: Secondary | ICD-10-CM

## 2022-02-15 DIAGNOSIS — Z3A34 34 weeks gestation of pregnancy: Secondary | ICD-10-CM | POA: Insufficient documentation

## 2022-02-15 MED ORDER — PRENATAL 27-0.8 MG PO TABS: 1.0000 | ORAL_TABLET | Freq: Every day | ORAL | 1 refills | Status: DC

## 2022-02-16 ENCOUNTER — Encounter: Payer: Medicaid Other | Admitting: Obstetrics and Gynecology

## 2022-02-19 DIAGNOSIS — Z419 Encounter for procedure for purposes other than remedying health state, unspecified: Secondary | ICD-10-CM | POA: Diagnosis not present

## 2022-03-01 ENCOUNTER — Ambulatory Visit (INDEPENDENT_AMBULATORY_CARE_PROVIDER_SITE_OTHER): Payer: Medicaid Other | Admitting: Obstetrics and Gynecology

## 2022-03-01 ENCOUNTER — Encounter: Payer: Self-pay | Admitting: Obstetrics and Gynecology

## 2022-03-01 VITALS — BP 110/58 | HR 83 | Wt 191.3 lb

## 2022-03-01 DIAGNOSIS — Z3A36 36 weeks gestation of pregnancy: Secondary | ICD-10-CM | POA: Diagnosis not present

## 2022-03-01 DIAGNOSIS — O98513 Other viral diseases complicating pregnancy, third trimester: Secondary | ICD-10-CM

## 2022-03-01 DIAGNOSIS — Z3685 Encounter for antenatal screening for Streptococcus B: Secondary | ICD-10-CM

## 2022-03-01 DIAGNOSIS — O98519 Other viral diseases complicating pregnancy, unspecified trimester: Secondary | ICD-10-CM

## 2022-03-01 DIAGNOSIS — Z3483 Encounter for supervision of other normal pregnancy, third trimester: Secondary | ICD-10-CM | POA: Diagnosis not present

## 2022-03-01 DIAGNOSIS — Z113 Encounter for screening for infections with a predominantly sexual mode of transmission: Secondary | ICD-10-CM

## 2022-03-01 DIAGNOSIS — Z8619 Personal history of other infectious and parasitic diseases: Secondary | ICD-10-CM

## 2022-03-01 DIAGNOSIS — B343 Parvovirus infection, unspecified: Secondary | ICD-10-CM

## 2022-03-01 LAB — POCT URINALYSIS DIPSTICK OB
Bilirubin, UA: NEGATIVE
Blood, UA: NEGATIVE
Glucose, UA: NEGATIVE
Ketones, UA: NEGATIVE
Leukocytes, UA: NEGATIVE
Nitrite, UA: NEGATIVE
Spec Grav, UA: 1.015 (ref 1.010–1.025)
Urobilinogen, UA: 0.2 E.U./dL
pH, UA: 7.5 (ref 5.0–8.0)

## 2022-03-01 MED ORDER — VALACYCLOVIR HCL 500 MG PO TABS: 500.0000 mg | ORAL_TABLET | Freq: Two times a day (BID) | ORAL | 1 refills | Status: DC

## 2022-03-01 NOTE — Progress Notes (Signed)
ROB: Patient overall doing well.  Does note that she is having an increase in discharge, unsure if it is an infection or if this is normal.  Does have a history of recurrent vaginitis with BV in the past.  Will swab today.  36-week cultures done today.  Review of most recent ultrasound from MFM notes that chorioangioma continues to decrease in size, only approximately 2.5 cm now.  May not require BPP's weekly as the chorionic angioma continues to decrease in size and has not caused any issues thus far.  We will rescreen for parvovirus antibodies as recommended.  We will initiate HSV prophylaxis with Valtrex.  Given labor precautions.  RTC in 1 week. ?

## 2022-03-01 NOTE — Progress Notes (Signed)
ROB: She is doing well, no new concerns. GBS and G/C swabs done. ?

## 2022-03-02 LAB — PARVOVIRUS B19 ANTIBODY, IGG AND IGM
Parvovirus B19 IgG: 2.6 index — ABNORMAL HIGH (ref 0.0–0.8)
Parvovirus B19 IgM: 0.5 index (ref 0.0–0.8)

## 2022-03-03 LAB — STREP GP B NAA+RFLX: Strep Gp B NAA+Rflx: NEGATIVE

## 2022-03-04 LAB — NUSWAB VAGINITIS PLUS (VG+)
Candida albicans, NAA: NEGATIVE
Candida glabrata, NAA: NEGATIVE
Chlamydia trachomatis, NAA: NEGATIVE
Neisseria gonorrhoeae, NAA: NEGATIVE
Trich vag by NAA: NEGATIVE

## 2022-03-08 ENCOUNTER — Ambulatory Visit (INDEPENDENT_AMBULATORY_CARE_PROVIDER_SITE_OTHER): Payer: Medicaid Other | Admitting: Obstetrics and Gynecology

## 2022-03-08 ENCOUNTER — Encounter: Payer: Self-pay | Admitting: Obstetrics and Gynecology

## 2022-03-08 ENCOUNTER — Other Ambulatory Visit: Payer: Self-pay | Admitting: Obstetrics and Gynecology

## 2022-03-08 VITALS — BP 107/65 | HR 91 | Wt 185.6 lb

## 2022-03-08 DIAGNOSIS — Z3A37 37 weeks gestation of pregnancy: Secondary | ICD-10-CM

## 2022-03-08 DIAGNOSIS — Z3483 Encounter for supervision of other normal pregnancy, third trimester: Secondary | ICD-10-CM

## 2022-03-08 LAB — POCT URINALYSIS DIPSTICK OB
Bilirubin, UA: NEGATIVE
Blood, UA: NEGATIVE
Glucose, UA: NEGATIVE
Ketones, UA: NEGATIVE
Leukocytes, UA: NEGATIVE
Nitrite, UA: NEGATIVE
POC,PROTEIN,UA: NEGATIVE
Spec Grav, UA: 1.015 (ref 1.010–1.025)
Urobilinogen, UA: 0.2 E.U./dL
pH, UA: 6.5 (ref 5.0–8.0)

## 2022-03-08 NOTE — Progress Notes (Signed)
ROB. Patient states fetal movement with pressure. She states she has been having Braxton Hicks contractions but nothing consistent. Patient also states she would like to discuss induction and her options.  Patient states no questions or concerns at this time.   ?

## 2022-03-08 NOTE — Progress Notes (Signed)
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  Discussed induction after 39 weeks estimated gestation.  At next visit she will inform us of the best time during the week for her and we will check the labor and delivery scheduled.  Confirmed with Dr. Marcelline Mates.  Cervical check today shows soft cervix fingertip no effacement. Taking valtrex ?

## 2022-03-16 ENCOUNTER — Encounter: Payer: Self-pay | Admitting: Obstetrics and Gynecology

## 2022-03-16 ENCOUNTER — Ambulatory Visit (INDEPENDENT_AMBULATORY_CARE_PROVIDER_SITE_OTHER): Payer: Medicaid Other | Admitting: Obstetrics and Gynecology

## 2022-03-16 VITALS — BP 97/58 | HR 92 | Wt 188.5 lb

## 2022-03-16 DIAGNOSIS — O09892 Supervision of other high risk pregnancies, second trimester: Secondary | ICD-10-CM

## 2022-03-16 DIAGNOSIS — Z3A38 38 weeks gestation of pregnancy: Secondary | ICD-10-CM

## 2022-03-16 DIAGNOSIS — Z3483 Encounter for supervision of other normal pregnancy, third trimester: Secondary | ICD-10-CM

## 2022-03-16 LAB — POCT URINALYSIS DIPSTICK OB
Bilirubin, UA: NEGATIVE
Blood, UA: NEGATIVE
Glucose, UA: NEGATIVE
Ketones, UA: NEGATIVE
Leukocytes, UA: NEGATIVE
Nitrite, UA: NEGATIVE
Spec Grav, UA: 1.015 (ref 1.010–1.025)
Urobilinogen, UA: 0.2 E.U./dL
pH, UA: 7 (ref 5.0–8.0)

## 2022-03-16 NOTE — Progress Notes (Signed)
ROB: She is doing well, has no new concerns today. ?

## 2022-03-16 NOTE — Progress Notes (Signed)
ROB: Patient doing well, no complaints. Feels some Braxton Hicks and round ligament pain at times.  Scheduled IOL for 03/26/2022 at midnight. RTC in 1 week. Labor precautions given.  ?

## 2022-03-21 DIAGNOSIS — Z419 Encounter for procedure for purposes other than remedying health state, unspecified: Secondary | ICD-10-CM | POA: Diagnosis not present

## 2022-03-23 ENCOUNTER — Encounter: Payer: Medicaid Other | Admitting: Obstetrics and Gynecology

## 2022-03-23 DIAGNOSIS — Z3483 Encounter for supervision of other normal pregnancy, third trimester: Secondary | ICD-10-CM

## 2022-03-23 DIAGNOSIS — Z3A39 39 weeks gestation of pregnancy: Secondary | ICD-10-CM

## 2022-03-24 ENCOUNTER — Ambulatory Visit (INDEPENDENT_AMBULATORY_CARE_PROVIDER_SITE_OTHER): Payer: Medicaid Other | Admitting: Obstetrics and Gynecology

## 2022-03-24 ENCOUNTER — Encounter: Payer: Self-pay | Admitting: Obstetrics and Gynecology

## 2022-03-24 VITALS — BP 108/72 | HR 96 | Wt 189.8 lb

## 2022-03-24 DIAGNOSIS — Z3A39 39 weeks gestation of pregnancy: Secondary | ICD-10-CM

## 2022-03-24 DIAGNOSIS — Z3483 Encounter for supervision of other normal pregnancy, third trimester: Secondary | ICD-10-CM

## 2022-03-24 LAB — POCT URINALYSIS DIPSTICK OB
Bilirubin, UA: NEGATIVE
Blood, UA: NEGATIVE
Glucose, UA: NEGATIVE
Ketones, UA: NEGATIVE
Leukocytes, UA: NEGATIVE
Nitrite, UA: NEGATIVE
POC,PROTEIN,UA: NEGATIVE
Spec Grav, UA: 1.015 (ref 1.010–1.025)
Urobilinogen, UA: 0.2 E.U./dL
pH, UA: 7 (ref 5.0–8.0)

## 2022-03-24 NOTE — Progress Notes (Signed)
ROB: Has occasional contractions but "not strong".  Reports daily fetal movement.  Taking HSV prophylaxis as directed.  Induction scheduled for Saturday.  Discussed misoprostol induction.  Questions answered. ?

## 2022-03-24 NOTE — Progress Notes (Signed)
ROB. Patient states fetal movement and lots of pressure. She states braxton hicks contractions on and off over the past few days. Patient states no questions or concerns at this time.   ?

## 2022-03-26 ENCOUNTER — Inpatient Hospital Stay: Payer: Medicaid Other | Admitting: Anesthesiology

## 2022-03-26 ENCOUNTER — Inpatient Hospital Stay
Admission: EM | Admit: 2022-03-26 | Discharge: 2022-03-27 | DRG: 806 | Disposition: A | Discharge status: Home / Self Care | Payer: Medicaid Other | Attending: Obstetrics and Gynecology | Admitting: Obstetrics and Gynecology

## 2022-03-26 ENCOUNTER — Encounter: Payer: Self-pay | Admitting: Obstetrics and Gynecology

## 2022-03-26 ENCOUNTER — Other Ambulatory Visit: Payer: Self-pay

## 2022-03-26 DIAGNOSIS — Z3490 Encounter for supervision of normal pregnancy, unspecified, unspecified trimester: Secondary | ICD-10-CM | POA: Diagnosis not present

## 2022-03-26 DIAGNOSIS — O26893 Other specified pregnancy related conditions, third trimester: Secondary | ICD-10-CM | POA: Diagnosis not present

## 2022-03-26 DIAGNOSIS — A6000 Herpesviral infection of urogenital system, unspecified: Secondary | ICD-10-CM | POA: Diagnosis present

## 2022-03-26 DIAGNOSIS — Z3A39 39 weeks gestation of pregnancy: Secondary | ICD-10-CM | POA: Diagnosis not present

## 2022-03-26 DIAGNOSIS — O9832 Other infections with a predominantly sexual mode of transmission complicating childbirth: Secondary | ICD-10-CM | POA: Diagnosis not present

## 2022-03-26 LAB — CBC
HCT: 33.8 % — ABNORMAL LOW (ref 36.0–46.0)
Hemoglobin: 10.7 g/dL — ABNORMAL LOW (ref 12.0–15.0)
MCH: 31.3 pg (ref 26.0–34.0)
MCHC: 31.7 g/dL (ref 30.0–36.0)
MCV: 98.8 fL (ref 80.0–100.0)
Platelets: 243 10*3/uL (ref 150–400)
RBC: 3.42 MIL/uL — ABNORMAL LOW (ref 3.87–5.11)
RDW: 14.1 % (ref 11.5–15.5)
WBC: 8.1 10*3/uL (ref 4.0–10.5)
nRBC: 0 % (ref 0.0–0.2)

## 2022-03-26 LAB — TYPE AND SCREEN
ABO/RH(D): O POS
Antibody Screen: NEGATIVE

## 2022-03-26 LAB — RPR: RPR Ser Ql: NONREACTIVE

## 2022-03-26 MED ORDER — BENZOCAINE-MENTHOL 20-0.5 % EX AERO
1.0000 "application " | INHALATION_SPRAY | CUTANEOUS | Status: DC | PRN
  Administered 2022-03-26: 1 via TOPICAL
  Filled 2022-03-26: qty 56

## 2022-03-26 MED ORDER — ZOLPIDEM TARTRATE 5 MG PO TABS: 5.0000 mg | ORAL_TABLET | Freq: Every evening | ORAL | Status: DC | PRN

## 2022-03-26 MED ORDER — LACTATED RINGERS IV SOLN: 500.0000 mL | INTRAVENOUS | Status: DC | PRN

## 2022-03-26 MED ORDER — SIMETHICONE 80 MG PO CHEW
80.0000 mg | CHEWABLE_TABLET | ORAL | Status: DC | PRN
  Administered 2022-03-26: 80 mg via ORAL
  Filled 2022-03-26: qty 1

## 2022-03-26 MED ORDER — DOCUSATE SODIUM 100 MG PO CAPS
100.0000 mg | ORAL_CAPSULE | Freq: Two times a day (BID) | ORAL | Status: DC
  Administered 2022-03-27 (×2): 100 mg via ORAL
  Filled 2022-03-26 (×2): qty 1

## 2022-03-26 MED ORDER — TETANUS-DIPHTH-ACELL PERTUSSIS 5-2.5-18.5 LF-MCG/0.5 IM SUSY
0.5000 mL | PREFILLED_SYRINGE | Freq: Once | INTRAMUSCULAR | Status: DC
  Filled 2022-03-26: qty 0.5

## 2022-03-26 MED ORDER — SODIUM CHLORIDE 0.9 % IV SOLN
INTRAVENOUS | Status: DC | PRN
  Administered 2022-03-26 (×2): 5 mL via EPIDURAL

## 2022-03-26 MED ORDER — OXYTOCIN-SODIUM CHLORIDE 30-0.9 UT/500ML-% IV SOLN: 2.5000 [IU]/h | INTRAVENOUS | Status: DC | PRN

## 2022-03-26 MED ORDER — BUTORPHANOL TARTRATE 1 MG/ML IJ SOLN: 1.0000 mg | INTRAMUSCULAR | Status: DC | PRN

## 2022-03-26 MED ORDER — PHENYLEPHRINE 80 MCG/ML (10ML) SYRINGE FOR IV PUSH (FOR BLOOD PRESSURE SUPPORT)
80.0000 ug | PREFILLED_SYRINGE | INTRAVENOUS | Status: DC | PRN
  Filled 2022-03-26: qty 10

## 2022-03-26 MED ORDER — DIPHENHYDRAMINE HCL 25 MG PO CAPS
25.0000 mg | ORAL_CAPSULE | Freq: Four times a day (QID) | ORAL | Status: DC | PRN
  Administered 2022-03-26: 25 mg via ORAL
  Filled 2022-03-26: qty 1

## 2022-03-26 MED ORDER — EPHEDRINE 5 MG/ML INJ
10.0000 mg | INTRAVENOUS | Status: DC | PRN
  Filled 2022-03-26: qty 2

## 2022-03-26 MED ORDER — LIDOCAINE HCL (PF) 1 % IJ SOLN
INTRAMUSCULAR | Status: DC | PRN
  Administered 2022-03-26: 2 mL via SUBCUTANEOUS

## 2022-03-26 MED ORDER — FENTANYL-BUPIVACAINE-NACL 0.5-0.125-0.9 MG/250ML-% EP SOLN
12.0000 mL/h | EPIDURAL | Status: DC | PRN
  Administered 2022-03-26: 12 mL/h via EPIDURAL
  Filled 2022-03-26: qty 250

## 2022-03-26 MED ORDER — MISOPROSTOL 25 MCG QUARTER TABLET
50.0000 ug | ORAL_TABLET | ORAL | Status: DC | PRN
  Administered 2022-03-26: 50 ug via VAGINAL
  Filled 2022-03-26 (×3): qty 1

## 2022-03-26 MED ORDER — DIPHENHYDRAMINE HCL 50 MG/ML IJ SOLN: 12.5000 mg | INTRAMUSCULAR | Status: DC | PRN

## 2022-03-26 MED ORDER — LACTATED RINGERS IV SOLN
500.0000 mL | Freq: Once | INTRAVENOUS | Status: AC
  Administered 2022-03-26: 500 mL via INTRAVENOUS

## 2022-03-26 MED ORDER — AMMONIA AROMATIC IN INHA
RESPIRATORY_TRACT | Status: AC
  Filled 2022-03-26: qty 10

## 2022-03-26 MED ORDER — IBUPROFEN 600 MG PO TABS
600.0000 mg | ORAL_TABLET | Freq: Four times a day (QID) | ORAL | Status: DC
  Administered 2022-03-26 – 2022-03-27 (×3): 600 mg via ORAL
  Filled 2022-03-26 (×4): qty 1

## 2022-03-26 MED ORDER — LIDOCAINE HCL (PF) 1 % IJ SOLN
INTRAMUSCULAR | Status: AC
  Filled 2022-03-26: qty 30

## 2022-03-26 MED ORDER — ACETAMINOPHEN 325 MG PO TABS
650.0000 mg | ORAL_TABLET | ORAL | Status: DC | PRN
Start: 2022-03-26 — End: 2022-03-26

## 2022-03-26 MED ORDER — OXYCODONE-ACETAMINOPHEN 5-325 MG PO TABS: 1.0000 | ORAL_TABLET | ORAL | Status: DC | PRN

## 2022-03-26 MED ORDER — SOD CITRATE-CITRIC ACID 500-334 MG/5ML PO SOLN: 30.0000 mL | ORAL | Status: DC | PRN

## 2022-03-26 MED ORDER — LIDOCAINE-EPINEPHRINE (PF) 1.5 %-1:200000 IJ SOLN
INTRAMUSCULAR | Status: DC | PRN
  Administered 2022-03-26: 3 mL via EPIDURAL

## 2022-03-26 MED ORDER — LIDOCAINE HCL (PF) 1 % IJ SOLN
30.0000 mL | INTRAMUSCULAR | Status: DC | PRN
Start: 2022-03-26 — End: 2022-03-26
  Filled 2022-03-26: qty 30

## 2022-03-26 MED ORDER — OXYTOCIN 10 UNIT/ML IJ SOLN
INTRAMUSCULAR | Status: AC
  Filled 2022-03-26: qty 2

## 2022-03-26 MED ORDER — OXYTOCIN-SODIUM CHLORIDE 30-0.9 UT/500ML-% IV SOLN
2.5000 [IU]/h | INTRAVENOUS | Status: DC
  Filled 2022-03-26: qty 500

## 2022-03-26 MED ORDER — ONDANSETRON HCL 4 MG/2ML IJ SOLN: 4.0000 mg | Freq: Four times a day (QID) | INTRAMUSCULAR | Status: DC | PRN

## 2022-03-26 MED ORDER — METHYLERGONOVINE MALEATE 0.2 MG/ML IJ SOLN
INTRAMUSCULAR | Status: AC
  Filled 2022-03-26: qty 1

## 2022-03-26 MED ORDER — MISOPROSTOL 200 MCG PO TABS
ORAL_TABLET | ORAL | Status: AC
  Filled 2022-03-26: qty 4

## 2022-03-26 MED ORDER — PRENATAL MULTIVITAMIN CH
1.0000 | ORAL_TABLET | Freq: Every day | ORAL | Status: DC
  Administered 2022-03-26 – 2022-03-27 (×2): 1 via ORAL
  Filled 2022-03-26 (×2): qty 1

## 2022-03-26 MED ORDER — OXYTOCIN BOLUS FROM INFUSION
333.0000 mL | Freq: Once | INTRAVENOUS | Status: AC
  Administered 2022-03-26: 333 mL via INTRAVENOUS

## 2022-03-26 MED ORDER — METHYLERGONOVINE MALEATE 0.2 MG/ML IJ SOLN
0.2000 mg | Freq: Once | INTRAMUSCULAR | Status: AC
  Administered 2022-03-26: 0.2 mg via INTRAMUSCULAR

## 2022-03-26 MED ORDER — LACTATED RINGERS IV SOLN: INTRAVENOUS | Status: DC

## 2022-03-26 MED ORDER — ACETAMINOPHEN 325 MG PO TABS
650.0000 mg | ORAL_TABLET | ORAL | Status: DC | PRN
  Administered 2022-03-26 – 2022-03-27 (×2): 650 mg via ORAL
  Filled 2022-03-26 (×3): qty 2

## 2022-03-26 NOTE — Anesthesia Preprocedure Evaluation (Signed)
Anesthesia Evaluation  ?Patient identified by MRN, date of birth, ID band ?Patient awake ? ? ? ?Reviewed: ?Allergy & Precautions, H&P , NPO status , Patient's Chart, lab work & pertinent test results ? ?History of Anesthesia Complications ?Negative for: history of anesthetic complications ? ?Airway ?Mallampati: II ? ?TM Distance: >3 FB ? ? ? ? Dental ?no notable dental hx. ? ?  ?Pulmonary ?neg pulmonary ROS,  ?  ? ? ? ? ? ? ? Cardiovascular ?negative cardio ROS ? ? ? ? ?  ?Neuro/Psych ?negative neurological ROS ? negative psych ROS  ? GI/Hepatic ?Neg liver ROS, GERD  ,  ?Endo/Other  ?negative endocrine ROS ? Renal/GU ?negative Renal ROS  ?negative genitourinary ?  ?Musculoskeletal ? ? Abdominal ?  ?Peds ? Hematology ? ?(+) Blood dyscrasia, anemia ,   ?Anesthesia Other Findings ?Past Medical History: ?No date: Anemia ?09/18/2020: History of herpes genitalis ? ? Reproductive/Obstetrics ?(+) Pregnancy ? ?  ? ? ? ? ? ? ? ? ? ? ? ? ? ?  ?  ? ? ? ? ? ? ? ? ?Anesthesia Physical ? ?Anesthesia Plan ? ?ASA: 2 ? ?Anesthesia Plan: Epidural  ? ?Post-op Pain Management:   ? ?Induction:  ? ?PONV Risk Score and Plan:  ? ?Airway Management Planned:  ? ?Additional Equipment:  ? ?Intra-op Plan:  ? ?Post-operative Plan:  ? ?Informed Consent: I have reviewed the patients History and Physical, chart, labs and discussed the procedure including the risks, benefits and alternatives for the proposed anesthesia with the patient or authorized representative who has indicated his/her understanding and acceptance.  ? ? ? ? ? ?Plan Discussed with: Anesthesiologist and CRNA ? ?Anesthesia Plan Comments:   ? ? ? ? ? ? ?Anesthesia Quick Evaluation ? ?

## 2022-03-26 NOTE — Plan of Care (Signed)
  Problem: Education: Goal: Knowledge of General Education information will improve Description: Including pain rating scale, medication(s)/side effects and non-pharmacologic comfort measures Outcome: Progressing   Problem: Clinical Measurements: Goal: Ability to maintain clinical measurements within normal limits will improve Outcome: Progressing   Problem: Clinical Measurements: Goal: Will remain free from infection Outcome: Progressing   Problem: Nutrition: Goal: Adequate nutrition will be maintained Outcome: Progressing   Problem: Pain Managment: Goal: General experience of comfort will improve Outcome: Progressing   Problem: Safety: Goal: Ability to remain free from injury will improve Outcome: Progressing   

## 2022-03-26 NOTE — H&P (Signed)
? ? ? ?History and Physical ? ? ?HPI ? ?Wanda Richmond is a 31 y.o. [id number] at [redacted]w[redacted]d Estimated Date of Delivery: 03/29/22 who is being admitted for induction at 39 weeks.  ? ? ?OB History ? ?OB History  ?Gravida Para Term Preterm AB Living  ?5 2 2  0 2 2  ?SAB IAB Ectopic Multiple Live Births  ?1 0 0 0 2  ?  ?# Outcome Date GA Lbr Len/2nd Weight Sex Delivery Anes PTL Lv  ?5 Current           ?4 AB 2022          ?3 Term 09/18/20 [redacted]w[redacted]d 00:25 / 00:11 3220 g M Vag-Spont EPI  LIV  ?   Name: EMIKA, CAVIL  ?   Apgar1: 7  Apgar5: 9  ?2 SAB 2015          ?1 Term 2013 [redacted]w[redacted]d  3685 g F Vag-Spont  Y LIV  ? ? ?PROBLEM LIST ? ?Pregnancy complications or risks: ?Patient Active Problem List  ? Diagnosis Date Noted  ? Term pregnancy 03/26/2022  ? Parvovirus B19 infection complicating pregnancy 10/01/2021  ? Short interval between pregnancies affecting pregnancy in second trimester, antepartum 10/01/2021  ? History of herpes genitalis 09/18/2020  ? UTI in pregnancy, antepartum, first trimester 04/07/2020  ? ? ?Prenatal labs and studies: ?ABO, Rh: --/--/O POS (05/06 0100) ?Antibody: NEG (05/06 0100) ?Rubella: 2.91 (10/21 1143) ?RPR: Non Reactive (02/09 1010)  ?HBsAg: Negative (10/21 1143)  ?HIV: Non Reactive (10/21 1143)  ?GBS:--/Negative (04/11 1629) ? ? ?Past Medical History:  ?Diagnosis Date  ? Anemia   ? History of herpes genitalis 09/18/2020  ? ? ? ?Past Surgical History:  ?Procedure Laterality Date  ? WISDOM TOOTH EXTRACTION    ? ? ? ?Medications   ? ?Current Discharge Medication List  ?  ? ?CONTINUE these medications which have NOT CHANGED  ? Details  ?ferrous sulfate 325 (65 FE) MG tablet Take 325 mg by mouth daily with breakfast.  ?  ?omeprazole (PRILOSEC) 10 MG capsule Take 10 mg by mouth daily.  ?  ?Prenatal Vit-Fe Fumarate-FA (MULTIVITAMIN-PRENATAL) 27-0.8 MG TABS tablet Take 1 tablet by mouth daily at 12 noon. ?Qty: 90 tablet, Refills: 1  ?  ?valACYclovir (VALTREX) 500 MG tablet Take 1 tablet (500 mg total) by mouth 2  (two) times daily. ?Qty: 30 tablet, Refills: 1  ?  ?  ? ? ? ?Allergies ? ?Penicillins ? ?Review of Systems ? ?Pertinent items noted in HPI and remainder of comprehensive ROS otherwise negative. ? ?Physical Exam ? ?BP 104/61   Pulse 83   Temp 98 ?F (36.7 ?C) (Oral)   Resp 18   Ht 5\' 6"  (1.676 m)   Wt 86.1 kg   LMP 06/22/2021 (Exact Date)   SpO2 95%   BMI 30.63 kg/m?  ? ?Lungs:  CTA B ?Cardio: RRR without M/R/G ?Abd: Soft, gravid, NT ?Presentation: cephalic ?EXT: No C/C/ 1+ Edema ?DTRs: 2+ B ?CERVIX: Dilation: 10 ?Dilation Complete Date: 03/26/22 ?Dilation Complete Time: 0816 ?Effacement (%): 50 ?Station: -3 ?Presentation: Vertex ?Exam by:: Logan Bores MD ? ?See Prenatal records for more detailed PE. ? ?  ? ?FHR:  ?Variability: Good {> 6 bpm) ? ?Toco: ?Uterine Contractions: Q2-4 ? ?Test Results ? ?Results for orders placed or performed during the hospital encounter of 03/26/22 (from the past 24 hour(s))  ?CBC     Status: Abnormal  ? Collection Time: 03/26/22  1:00 AM  ?Result Value Ref Range  ?  WBC 8.1 4.0 - 10.5 K/uL  ? RBC 3.42 (L) 3.87 - 5.11 MIL/uL  ? Hemoglobin 10.7 (L) 12.0 - 15.0 g/dL  ? HCT 33.8 (L) 36.0 - 46.0 %  ? MCV 98.8 80.0 - 100.0 fL  ? MCH 31.3 26.0 - 34.0 pg  ? MCHC 31.7 30.0 - 36.0 g/dL  ? RDW 14.1 11.5 - 15.5 %  ? Platelets 243 150 - 400 K/uL  ? nRBC 0.0 0.0 - 0.2 %  ?Type and screen     Status: None  ? Collection Time: 03/26/22  1:00 AM  ?Result Value Ref Range  ? ABO/RH(D) O POS   ? Antibody Screen NEG   ? Sample Expiration    ?  03/29/2022,2359 ?Performed at [facility], [address], [place], [place] [address] ?  ? ? ? ?Assessment ? ? [id number] at [redacted]w[redacted]d Estimated Date of Delivery: 03/29/22  ?The fetus is reassuring.  ? ?Patient Active Problem List  ? Diagnosis Date Noted  ? Term pregnancy 03/26/2022  ? Parvovirus [id number] infection complicating pregnancy [id number]  ? Short interval between pregnancies affecting pregnancy in second trimester, antepartum 10/01/2021  ? History of herpes  genitalis 09/18/2020  ? UTI in pregnancy, antepartum, first trimester 04/07/2020  ? ? ?Plan ? ?1. Admit to L&D :   ?2. EFM: -- Category 1 ?3. Stadol or Epidural if desired.   ?4. Admission labs  ?5. Expect vaginal delivery ? ?Finis Bud, M.D. ?03/26/2022 ?8:20 AM  ?

## 2022-03-26 NOTE — Anesthesia Procedure Notes (Signed)
Epidural ?Patient location during procedure: OB ?Start time: 03/26/2022 6:26 AM ?End time: 03/26/2022 6:30 AM ? ?Staffing ?Anesthesiologist: Martha Clan, MD ?Performed: anesthesiologist  ? ?Preanesthetic Checklist ?Completed: patient identified, IV checked, site marked, risks and benefits discussed, surgical consent, monitors and equipment checked, pre-op evaluation and timeout performed ? ?Epidural ?Patient position: sitting ?Prep: ChloraPrep ?Patient monitoring: heart rate, continuous pulse ox and blood pressure ?Approach: midline ?Location: L3-L4 ?Injection technique: LOR saline ? ?Needle:  ?Needle type: Tuohy  ?Needle gauge: 17 G ?Needle length: 9 cm and 9 ?Needle insertion depth: 5 cm ?Catheter type: closed end flexible ?Catheter size: 19 Gauge ?Catheter at skin depth: 10 cm ?Test dose: negative and 1.5% lidocaine with Epi 1:200 K ? ?Assessment ?Sensory level: T10 ?Events: blood not aspirated, injection not painful, no injection resistance, no paresthesia and negative IV test ? ?Additional Notes ?1st attempt ?Pt. Evaluated and documentation done after procedure finished. ?Patient identified. Risks/Benefits/Options discussed with patient including but not limited to bleeding, infection, nerve damage, paralysis, failed block, incomplete pain control, headache, blood pressure changes, nausea, vomiting, reactions to medication both or allergic, itching and postpartum back pain. Confirmed with bedside nurse the patient's most recent platelet count. Confirmed with patient that they are not currently taking any anticoagulation, have any bleeding history or any family history of bleeding disorders. Patient expressed understanding and wished to proceed. All questions were answered. Sterile technique was used throughout the entire procedure. Please see nursing notes for vital signs. Test dose was given through epidural catheter and negative prior to continuing to dose epidural or start infusion. Warning signs of high  block given to the patient including shortness of breath, tingling/numbness in hands, complete motor block, or any concerning symptoms with instructions to call for help. Patient was given instructions on fall risk and not to get out of bed. All questions and concerns addressed with instructions to call with any issues or inadequate analgesia.   ? ?Patient tolerated the insertion well without immediate complications.Reason for block:procedure for pain ? ? ? ?

## 2022-03-27 LAB — CBC
HCT: 29.9 % — ABNORMAL LOW (ref 36.0–46.0)
Hemoglobin: 9.5 g/dL — ABNORMAL LOW (ref 12.0–15.0)
MCH: 31.8 pg (ref 26.0–34.0)
MCHC: 31.8 g/dL (ref 30.0–36.0)
MCV: 100 fL (ref 80.0–100.0)
Platelets: 215 10*3/uL (ref 150–400)
RBC: 2.99 MIL/uL — ABNORMAL LOW (ref 3.87–5.11)
RDW: 14.2 % (ref 11.5–15.5)
WBC: 11.1 10*3/uL — ABNORMAL HIGH (ref 4.0–10.5)
nRBC: 0 % (ref 0.0–0.2)

## 2022-03-27 MED ORDER — MEDROXYPROGESTERONE ACETATE 150 MG/ML IM SUSP
150.0000 mg | Freq: Once | INTRAMUSCULAR | Status: DC
Start: 2022-03-27 — End: 2023-05-18

## 2022-03-27 MED ORDER — IBUPROFEN 600 MG PO TABS: 600.0000 mg | ORAL_TABLET | Freq: Four times a day (QID) | ORAL | 0 refills | Status: DC

## 2022-03-27 MED ORDER — MEDROXYPROGESTERONE ACETATE 150 MG/ML IM SUSP
150.0000 mg | Freq: Once | INTRAMUSCULAR | Status: AC
Start: 2022-03-27 — End: 2022-03-27
  Administered 2022-03-27: 150 mg via INTRAMUSCULAR
  Filled 2022-03-27 (×2): qty 1

## 2022-03-27 NOTE — Progress Notes (Signed)
Patient discharged home with infant. Discharge instructions and prescriptions given and reviewed with patient. Patient verbalized understanding.  ? ?Explained importance of calling and scheduling follow-up appointments with Dr. Logan Bores at 2-weeks and 6-weeks.  ? ?Depo shot given 03/27/22 prior to discharge in L deltoid.  ? ?Will be escorted out by volunteers. ?

## 2022-03-27 NOTE — Anesthesia Postprocedure Evaluation (Signed)
Anesthesia Post Note ? ?Patient: Wanda Richmond ? ?Procedure(s) Performed: AN AD HOC LABOR EPIDURAL ? ?Patient location during evaluation: Mother Baby ?Anesthesia Type: Epidural ?Level of consciousness: awake and alert ?Pain management: pain level controlled ?Vital Signs Assessment: post-procedure vital signs reviewed and stable ?Respiratory status: spontaneous breathing, nonlabored ventilation and respiratory function stable ?Cardiovascular status: stable ?Postop Assessment: no headache, no backache and able to ambulate ?Anesthetic complications: no ? ? ?No notable events documented. ? ? ?Last Vitals:  ?Vitals:  ? 03/27/22 0338 03/27/22 0913  ?BP: 96/72 96/66  ?Pulse: 86 70  ?Resp: 20 20  ?Temp: 37.1 ?C 36.6 ?C  ?SpO2: 98% 100%  ?  ?Last Pain:  ?Vitals:  ? 03/27/22 0913  ?TempSrc: Oral  ?PainSc:   ? ? ?  ?  ?  ?  ?  ?  ? ?Lenard Simmer ? ? ? ? ?

## 2022-03-27 NOTE — Anesthesia Post-op Follow-up Note (Signed)
?  Anesthesia Pain Follow-up Note ? ?Patient: Wanda Richmond ? ?Day #: 1 ? ?Date of Follow-up: 03/27/2022 Time: 9:21 AM ? ?Last Vitals:  ?Vitals:  ? 03/27/22 0338 03/27/22 0913  ?BP: 96/72 96/66  ?Pulse: 86 70  ?Resp: 20 20  ?Temp: 37.1 ?C 36.6 ?C  ?SpO2: 98% 100%  ? ? ?Level of Consciousness: alert ? ?Pain: mild back soreness ? ?Side Effects:None ? ?Catheter Site Exam:clean, dry ? ? ? ? ?Plan: D/C from anesthesia care at surgeon's request ? ?Lenard Simmer ? ? ? ?

## 2022-03-27 NOTE — Progress Notes (Signed)
Depo-provera shot given prior to discharge at 1100 in L deltoid.  ?

## 2022-03-27 NOTE — Discharge Summary (Signed)
? ?   ?Patient Name: Wanda Richmond ?DOB: [birth year] ?MRN: [id number] ? ?                          Discharge Summary ? ?Date of Admission: 03/26/2022 ?Date of Discharge: 03/27/2022 ?Delivering Provider: Linzie Collin  ? ?Admitting Diagnosis: Term pregnancy [Z34.90] at [redacted]w[redacted]d ?Secondary diagnosis:  Principal Problem: ?  Term pregnancy ? ?Mode of Delivery: normal spontaneous vaginal delivery          ?    ?Discharge diagnosis: Term Pregnancy Delivered    ?  ?Intrapartum Procedures: Misoprostol, AROM ?  ?Post partum procedures:  ? ?Complications:  ? ?                   Discharge Day SOAP Note: ? ?Progress Note - Vaginal Delivery ? ?Wanda Richmond is a 31 y.o. [id number] now PP day 1 s/p Vaginal, Spontaneous . Delivery was uncomplicated ? ?Subjective ? ?The patient has the following complaints: has no unusual complaints ? Pain is controlled with current medications.   Patient is urinating without difficulty.  She is ambulating well.   ?She desires discharge today. ? ?Objective ? ?Vital signs: ?BP 96/66   Pulse 70   Temp 97.8 ?F (36.6 ?C) (Oral)   Resp 20   Ht 5\' 6"  (1.676 m)   Wt 86.1 kg   LMP 06/22/2021 (Exact Date)   SpO2 100%   Breastfeeding Unknown   BMI 30.63 kg/m?  ? ?Physical Exam: ?Gen: NAD ?Fundus Fundal Tone: Firm  ?Lochia Amount: Scant  ?   ? ?  ?Data Review ?Labs: ?Lab Results  ?Component Value Date  ? WBC 11.1 (H) 03/27/2022  ? HGB 9.5 (L) 03/27/2022  ? HCT 29.9 (L) 03/27/2022  ? MCV 100.0 03/27/2022  ? PLT 215 03/27/2022  ? ? ?  Latest Ref Rng & Units 03/27/2022  ?  6:42 AM 03/26/2022  ?  1:00 AM 12/30/2021  ? 10:10 AM  ?CBC  ?WBC 4.0 - 10.5 K/uL 11.1   8.1   8.4    ?Hemoglobin 12.0 - 15.0 g/dL 9.5   02/27/2022   65.4    ?Hematocrit 36.0 - 46.0 % 29.9   33.8   34.3    ?Platelets 150 - 400 K/uL 215   243   241    ? ?O POS ? ?Edinburgh Score: ? ?  03/27/2022  ?  3:38 AM  ?05/27/2022 Postnatal Depression Scale Screening Tool  ?I have been able to laugh and see the funny side of things. 0  ?I have looked forward with  enjoyment to things. 0  ?I have blamed myself unnecessarily when things went wrong. 2  ?I have been anxious or worried for no good reason. 2  ?I have felt scared or panicky for no good reason. 1  ?Things have been getting on top of me. 1  ?I have been so unhappy that I have had difficulty sleeping. 0  ?I have felt sad or miserable. 0  ?I have been so unhappy that I have been crying. 1  ?The thought of harming myself has occurred to me. 0  ?Edinburgh Postnatal Depression Scale Total 7  ? ? ?Assessment/Plan ? ?Principal Problem: ?  Term pregnancy ?  ? ?Plan for discharge today. ? ?Discharge Instructions: Per After Visit Summary. ?Activity: Advance as tolerated. Pelvic rest for 6 weeks.  Also refer to After Visit Summary ?Diet: Regular ?Medications: ?Allergies as of 03/27/2022   ? ?  Reactions  ? Penicillins Shortness Of Breath, Cough  ? ?  ? ?  ?Medication List  ?  ? ?STOP taking these medications   ? ?omeprazole 10 MG capsule ?Commonly known as: PRILOSEC ?  ?valACYclovir 500 MG tablet ?Commonly known as: VALTREX ?  ? ?  ? ?TAKE these medications   ? ?ferrous sulfate 325 (65 FE) MG tablet ?Take 325 mg by mouth daily with breakfast. ?  ?ibuprofen 600 MG tablet ?Commonly known as: ADVIL ?Take 1 tablet (600 mg total) by mouth every 6 (six) hours. ?  ?multivitamin-prenatal 27-0.8 MG Tabs tablet ?Take 1 tablet by mouth daily at 12 noon. ?  ? ?  ? ?Outpatient follow up:  ? Follow-up Information   ? ? Linzie Collin, MD Follow up in 2 week(s).   ?Specialties: Obstetrics and Gynecology, Radiology ?Why: Ay do video visit if desired ?Contact information: ?[address] ?[address] ?[place] [address] ?[phone] ? ? ?  ?  ? ?  ?  ? ?  ? ?Postpartum contraception: Will discuss at first office visit post-partum ? ?Discharged Condition: good ? ?Discharged to: home ? ?Newborn Data: ?Disposition:home with mother ? ?Apgars: APGAR (1 MIN): 8   ?APGAR (5 MINS): 8   ?APGAR (10 MINS):   ? ?Baby Feeding: Bottle and  Breast ? ?  ?Elonda Husky, M.D. ?03/27/2022 ?9:58 AM ? ?

## 2022-03-27 NOTE — Discharge Instructions (Addendum)
Discharge Instructions:  ? ?Follow-up Appointment: Call and schedule your appointments for a visit in 2-weeks and 6-weeks with Dr. Logan Bores!  ? ?If there are any new medications, they have been ordered and will be available for pickup at the listed pharmacy on your way home from the hospital.  ? ?Call office if you have any of the following: headache, visual changes, fever >101.0 F, chills, shortness of breath, breast concerns, excessive vaginal bleeding, incision drainage or problems, leg pain or redness, depression or any other concerns. If you have vaginal discharge with an odor, let your doctor know.  ? ?It is normal to bleed for up to 6 weeks. You should not soak through more than 1 pad in 1 hour. If you have a blood clot larger than your fist with continued bleeding, call your doctor.  ? ?Activity: Do not lift > 10 lbs for 6 weeks (do not lift anything heavier than your baby). ?No intercourse, tampons, swimming pools, hot tubs, baths (only showers) for 6 weeks.  ?No driving for 1-2 weeks. ?Continue prenatal vitamin, especially if breastfeeding. ?Increase calories and fluids (water) while breastfeeding.  ? ?Your milk will come in, in the next couple of days (right now it is colostrum). You may have a slight fever when your milk comes in, but it should go away on its own.  If it does not, and rises above 101 F please call the doctor. You will also feel achy and your breasts will be firm. They will also start to leak. If you are breastfeeding, continue as you have been and you can pump/express milk for comfort.  ? ?If you have too much milk, your breasts can become engorged, which could lead to mastitis. This is an infection of the milk ducts. It can be very painful and you will need to notify your doctor to obtain a prescription for antibiotics. You can also treat it with a shower or hot/cold compress.  ? ?For concerns about your baby, please call your pediatrician.  ?For breastfeeding concerns, the lactation  consultant can be reached at [phone].  ? ?Postpartum blues (feelings of happy one minute and sad another minute) are normal for the first few weeks but if it gets worse let your doctor know.  ? ?Congratulations! We enjoyed caring for you and your new bundle of joy!   ?

## 2022-03-29 LAB — SURGICAL PATHOLOGY

## 2022-04-12 ENCOUNTER — Telehealth: Payer: Medicaid Other | Admitting: Obstetrics and Gynecology

## 2022-04-19 ENCOUNTER — Telehealth (INDEPENDENT_AMBULATORY_CARE_PROVIDER_SITE_OTHER): Payer: Medicaid Other | Admitting: Obstetrics and Gynecology

## 2022-04-19 ENCOUNTER — Encounter: Payer: Self-pay | Admitting: Obstetrics and Gynecology

## 2022-04-19 NOTE — Progress Notes (Signed)
Virtual Visit via Video Note  I connected with Wanda Richmond on 04/19/22 at  7:45 AM EDT by video and verified that I was speaking with the correct person using two identifiers.    Wanda Richmond is a 31 y.o. [phone] who LMP was No LMP recorded. I discussed the limitations, risks, security and privacy concerns of performing an evaluation and management service by video and the availability of in person appointments. I also discussed with the patient that there may be a patient responsible charge related to this service. The patient expressed understanding and agreed to proceed.  Location of patient:  Home  Patient gave explicit verbal consent for video visit:  YES  Location of provider:  [facility] office  Persons other than physician and patient involved in provider conference:  None   Subjective:   History of Present Illness:    Wanda Richmond is approximately 2 weeks postpartum.  Wanda Richmond continues to breast-feed full-time.  Wanda Richmond says her bleeding is almost finished.  Wanda Richmond initially had some pelvic discomfort/pain but Wanda Richmond says this is resolving.  Taking prenatal vitamins daily. Patient received Depo shot before leaving the hospital.  Hx: The following portions of the patient's history were reviewed and updated as appropriate:             Wanda Richmond  has a past medical history of Anemia and History of herpes genitalis (09/18/2020). Wanda Richmond does not have any pertinent problems on file. Wanda Richmond  has a past surgical history that includes Wisdom tooth extraction. Her family history includes Breast cancer in her paternal grandmother; Cervical cancer in her maternal grandmother; Healthy in her father and mother. Wanda Richmond  reports that Wanda Richmond has never smoked. Wanda Richmond has been exposed to tobacco smoke. Wanda Richmond has never used smokeless tobacco. Wanda Richmond reports that Wanda Richmond does not currently use alcohol. Wanda Richmond reports that Wanda Richmond does not use drugs. Wanda Richmond has a current medication list which includes the following prescription(s): ferrous sulfate, ibuprofen, and  multivitamin-prenatal, and the following Facility-Administered Medications: medroxyprogesterone. Wanda Richmond is allergic to penicillins.       Review of Systems:  Review of Systems  Constitutional: Denied constitutional symptoms, night sweats, recent illness, fatigue, fever, insomnia and weight loss.  Eyes: Denied eye symptoms, eye pain, photophobia, vision change and visual disturbance.  Ears/Nose/Throat/Neck: Denied ear, nose, throat or neck symptoms, hearing loss, nasal discharge, sinus congestion and sore throat.  Cardiovascular: Denied cardiovascular symptoms, arrhythmia, chest pain/pressure, edema, exercise intolerance, orthopnea and palpitations.  Respiratory: Denied pulmonary symptoms, asthma, pleuritic pain, productive sputum, cough, dyspnea and wheezing.  Gastrointestinal: Denied, gastro-esophageal reflux, melena, nausea and vomiting.  Genitourinary: Denied genitourinary symptoms including symptomatic vaginal discharge, pelvic relaxation issues, and urinary complaints.  Musculoskeletal: Denied musculoskeletal symptoms, stiffness, swelling, muscle weakness and myalgia.  Dermatologic: Denied dermatology symptoms, rash and scar.  Neurologic: Denied neurology symptoms, dizziness, headache, neck pain and syncope.  Psychiatric: Denied psychiatric symptoms, anxiety and depression.  Endocrine: Denied endocrine symptoms including hot flashes and night sweats.   Meds:   Current Outpatient Medications on File Prior to Visit  Medication Sig Dispense Refill   ferrous sulfate 325 (65 FE) MG tablet Take 325 mg by mouth daily with breakfast.     ibuprofen (ADVIL) 600 MG tablet Take 1 tablet (600 mg total) by mouth every 6 (six) hours. 30 tablet 0   Prenatal Vit-Fe Fumarate-FA (MULTIVITAMIN-PRENATAL) 27-0.8 MG TABS tablet Take 1 tablet by mouth daily at 12 noon. 90 tablet 1   Current Facility-Administered Medications on File Prior to Visit  Medication  Dose Route Frequency Provider Last Rate Last Admin    medroxyPROGESTERone (DEPO-PROVERA) injection 150 mg  150 mg Intramuscular Once Harlin Heys, MD        Assessment:    [phone] Patient Active Problem List   Diagnosis Date Noted   Term pregnancy 03/26/2022   Parvovirus [id number] infection complicating pregnancy [id number]   Short interval between pregnancies affecting pregnancy in second trimester, antepartum 10/01/2021   History of herpes genitalis 09/18/2020   UTI in pregnancy, antepartum, first trimester 04/07/2020     1. Postpartum care and examination immediately after delivery     Wanda Richmond is doing well postpartum.  Plan:            1.  Continue current care.  Plan follow-up in 4 weeks for postpartum visit. Orders No orders of the defined types were placed in this encounter.   No orders of the defined types were placed in this encounter.     F/U  Return in about 4 weeks (around 05/17/2022).   Finis Bud, M.D. 04/19/2022 7:42 AM

## 2022-04-21 DIAGNOSIS — Z419 Encounter for procedure for purposes other than remedying health state, unspecified: Secondary | ICD-10-CM | POA: Diagnosis not present

## 2022-05-05 ENCOUNTER — Ambulatory Visit (INDEPENDENT_AMBULATORY_CARE_PROVIDER_SITE_OTHER): Payer: Medicaid Other | Admitting: Obstetrics and Gynecology

## 2022-05-05 ENCOUNTER — Encounter: Payer: Self-pay | Admitting: Obstetrics and Gynecology

## 2022-05-05 NOTE — Progress Notes (Signed)
Patient presents today for 6 week postpartum follow-up. Patient had a vaginal delivery. Patient is breastfeeding. She states she started depo while in the hospital for birth control. She states upper thigh pain that has been bothersome. Patient states no other questions or concerns at this time.

## 2022-05-05 NOTE — Progress Notes (Signed)
HPI:      [name] is a 31 y.o. [phone] who LMP was No LMP recorded.  Subjective:   She presents today approximately 6 weeks postpartum.  She reports that she is doing well.  She still has intermittent bleeding but not heavy.  She received Depo-Provera injection prior to discharge from the hospital.  She believes that it might be causing some weight gain already and she is not happy with this.  She is considering other forms of birth control.  She has not resumed sexual activity.    Hx: The following portions of the patient's history were reviewed and updated as appropriate:             She  has a past medical history of Anemia and History of herpes genitalis (09/18/2020). She does not have any pertinent problems on file. She  has a past surgical history that includes Wisdom tooth extraction. Her family history includes Breast cancer in her paternal grandmother; Cervical cancer in her maternal grandmother; Healthy in her father and mother. She  reports that she has never smoked. She has been exposed to tobacco smoke. She has never used smokeless tobacco. She reports that she does not currently use alcohol. She reports that she does not use drugs. She has a current medication list which includes the following prescription(s): ferrous sulfate, ibuprofen, and multivitamin-prenatal, and the following Facility-Administered Medications: medroxyprogesterone. She is allergic to penicillins.       Review of Systems:  Review of Systems  Constitutional: Denied constitutional symptoms, night sweats, recent illness, fatigue, fever, insomnia and weight loss.  Eyes: Denied eye symptoms, eye pain, photophobia, vision change and visual disturbance.  Ears/Nose/Throat/Neck: Denied ear, nose, throat or neck symptoms, hearing loss, nasal discharge, sinus congestion and sore throat.  Cardiovascular: Denied cardiovascular symptoms, arrhythmia, chest pain/pressure, edema, exercise intolerance, orthopnea and  palpitations.  Respiratory: Denied pulmonary symptoms, asthma, pleuritic pain, productive sputum, cough, dyspnea and wheezing.  Gastrointestinal: Denied, gastro-esophageal reflux, melena, nausea and vomiting.  Genitourinary: Denied genitourinary symptoms including symptomatic vaginal discharge, pelvic relaxation issues, and urinary complaints.  Musculoskeletal: Denied musculoskeletal symptoms, stiffness, swelling, muscle weakness and myalgia.  Dermatologic: Denied dermatology symptoms, rash and scar.  Neurologic: Denied neurology symptoms, dizziness, headache, neck pain and syncope.  Psychiatric: Denied psychiatric symptoms, anxiety and depression.  Endocrine: Denied endocrine symptoms including hot flashes and night sweats.   Meds:   Current Outpatient Medications on File Prior to Visit  Medication Sig Dispense Refill   ferrous sulfate 325 (65 FE) MG tablet Take 325 mg by mouth daily with breakfast.     ibuprofen (ADVIL) 600 MG tablet Take 1 tablet (600 mg total) by mouth every 6 (six) hours. 30 tablet 0   Prenatal Vit-Fe Fumarate-FA (MULTIVITAMIN-PRENATAL) 27-0.8 MG TABS tablet Take 1 tablet by mouth daily at 12 noon. 90 tablet 1   Current Facility-Administered Medications on File Prior to Visit  Medication Dose Route Frequency Provider Last Rate Last Admin   medroxyPROGESTERone (DEPO-PROVERA) injection 150 mg  150 mg Intramuscular Once Linzie Collin, MD          Objective:     Vitals:   05/05/22 1058  BP: 92/61  Pulse: 96   Filed Weights   05/05/22 1058  Weight: 183 lb 3.2 oz (83.1 kg)              Pelvic examination   Pelvic:   Vulva: Normal appearance.  No lesions.  No abnormal scarring.    Vagina: No  lesions or abnormalities noted.  Support: Normal pelvic support.  Urethra No masses tenderness or scarring.  Meatus Normal size without lesions or prolapse.  Cervix: Normal ectropion.  No lesions.  Anus: Normal exam.  No lesions.  Perineum: Normal exam.  No  lesions.  Healed well.          Bimanual   Uterus: Normal size.  Non-tender.  Mobile.  AV.  Adnexae: No masses.  Non-tender to palpation.  Cul-de-sac: Negative for abnormality.             Assessment:    [id number] Patient Active Problem List   Diagnosis Date Noted   Term pregnancy 03/26/2022   Parvovirus B19 infection complicating pregnancy 10/01/2021   Short interval between pregnancies affecting pregnancy in second trimester, antepartum 10/01/2021   History of herpes genitalis 09/18/2020   UTI in pregnancy, antepartum, first trimester 04/07/2020     1. Postpartum care following vaginal delivery     Patient doing well postpartum-no issues.   Plan:            1.  May resume normal activities.  2.  Other forms of birth control other than Depo discussed.  Patient strongly considering IUD as she does not like the weight gain with Depo.  Plan follow-up at patient request for approximately 3 months. Orders No orders of the defined types were placed in this encounter.   No orders of the defined types were placed in this encounter.     F/U  Return in about 3 months (around 08/05/2022) for Annual Physical.  Elonda Husky, M.D. 05/05/2022 11:22 AM

## 2022-05-21 DIAGNOSIS — Z419 Encounter for procedure for purposes other than remedying health state, unspecified: Secondary | ICD-10-CM | POA: Diagnosis not present

## 2022-07-28 ENCOUNTER — Encounter: Payer: Medicaid Other | Admitting: Obstetrics and Gynecology

## 2023-02-15 IMAGING — US US MFM OB FOLLOW-UP
1 series · 13 of 28 positions shown · non-contrast
Comparison: none

[Series 1: us mfm ob follow-up · 13 of 51 slices shown]
[im 2/51]
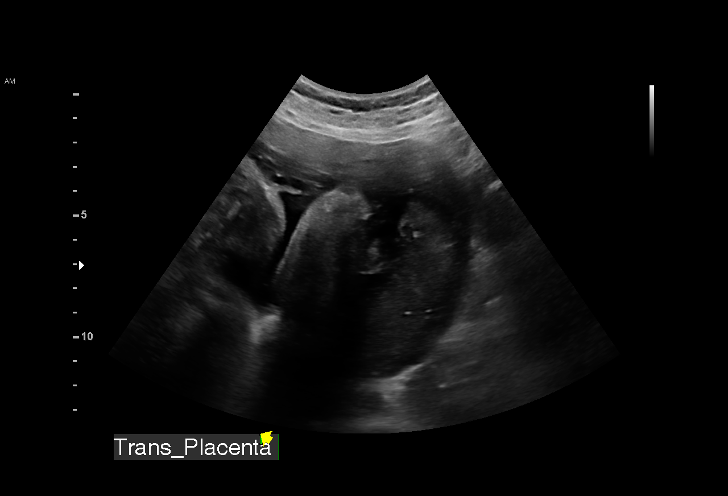
[im 6/51]
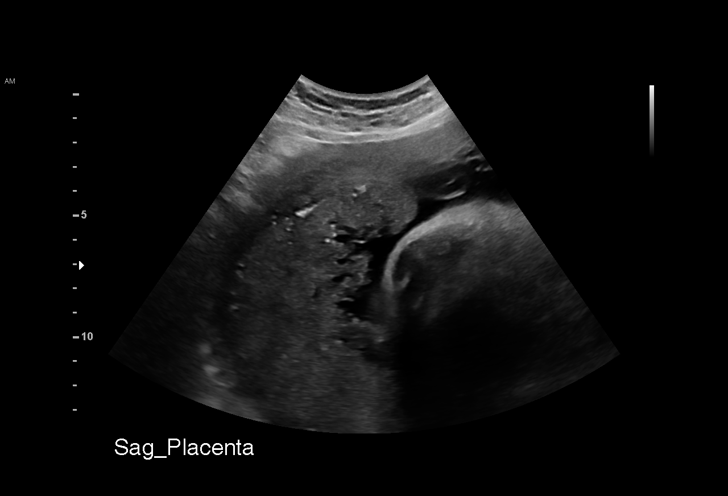
[im 10/51]
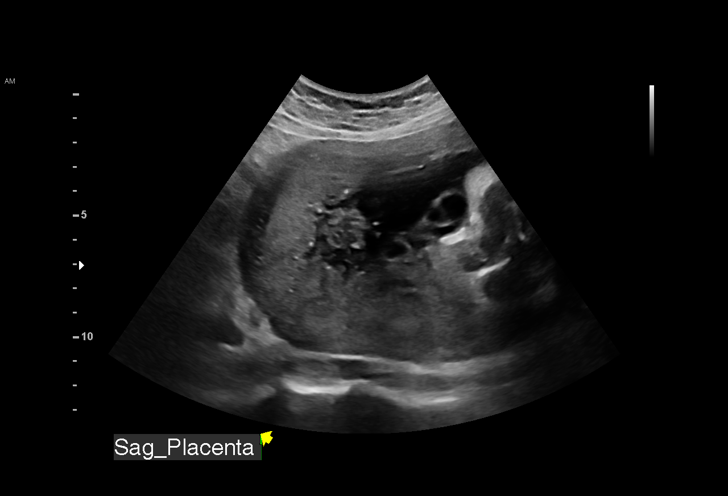
[im 13/51]
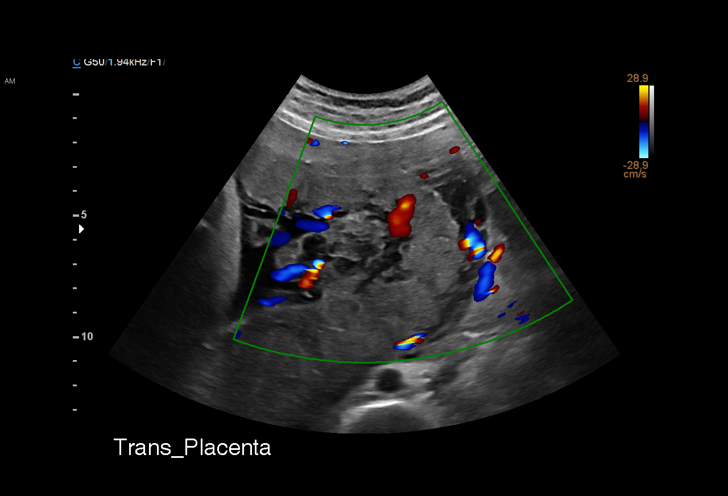
[im 17/51]
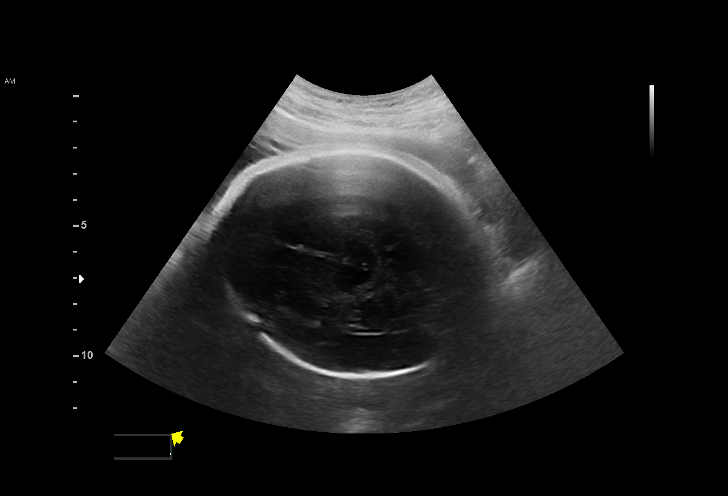
[im 21/51]
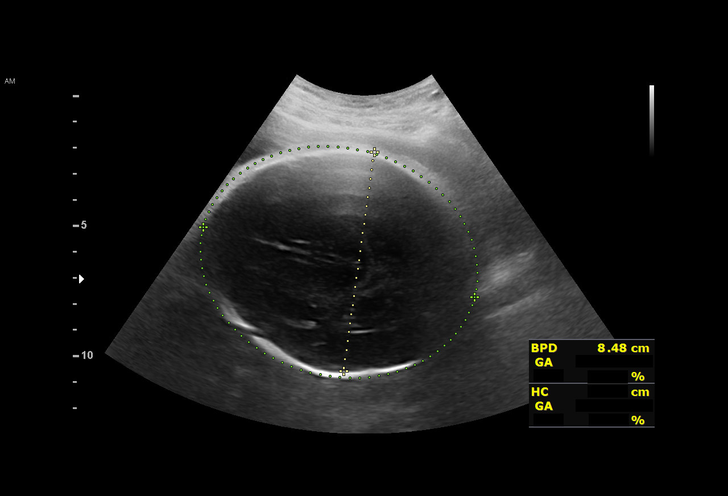
[im 26/51]
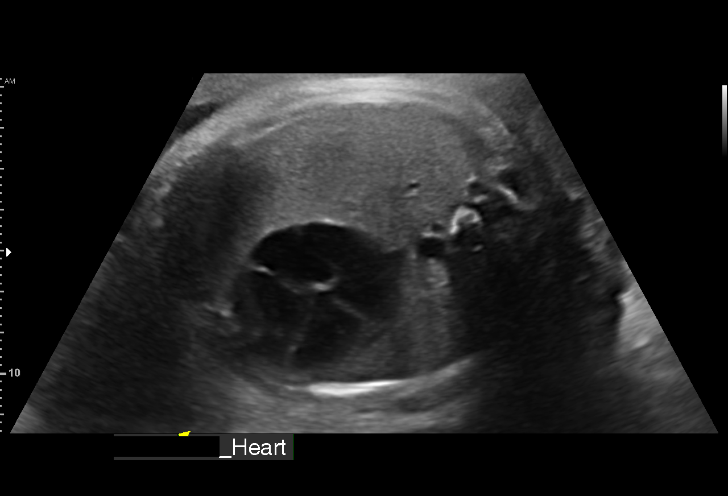
[im 30/51]
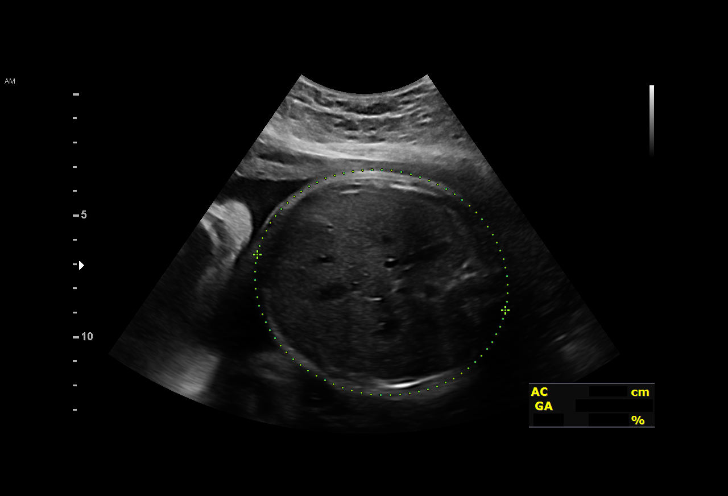
[im 34/51]
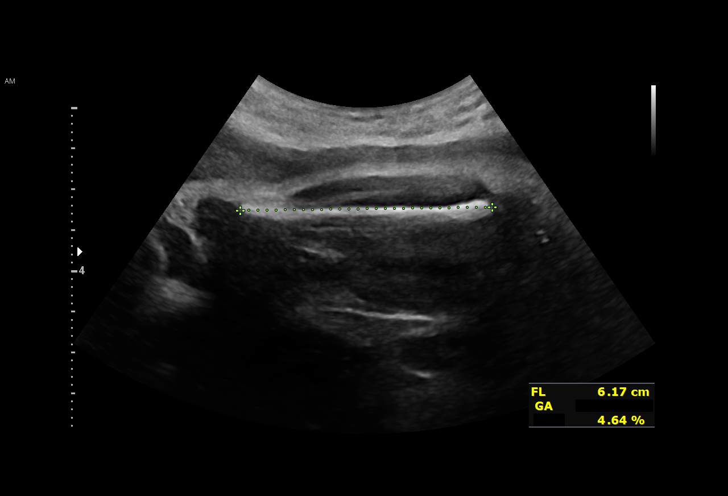
[im 38/51]
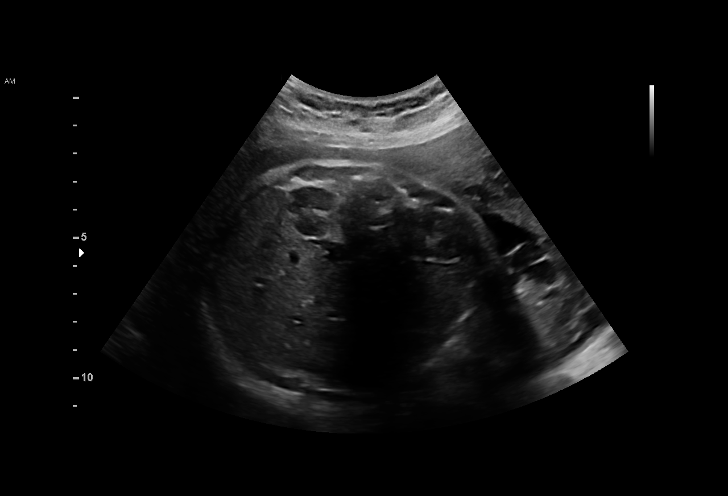
[im 41/51]
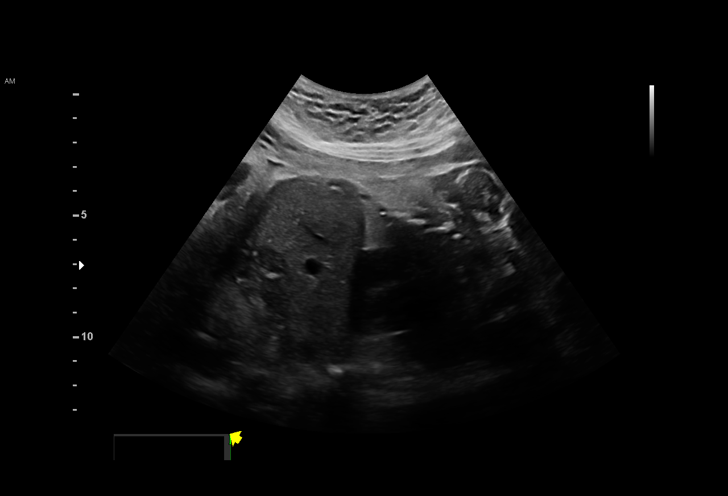
[im 45/51]
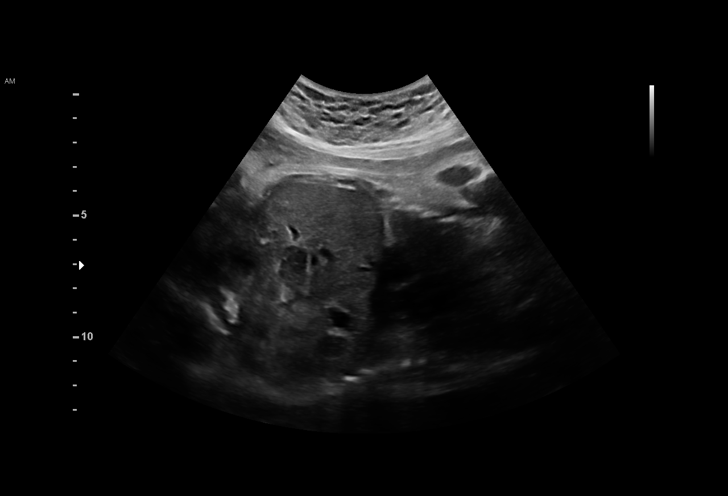
[im 49/51]
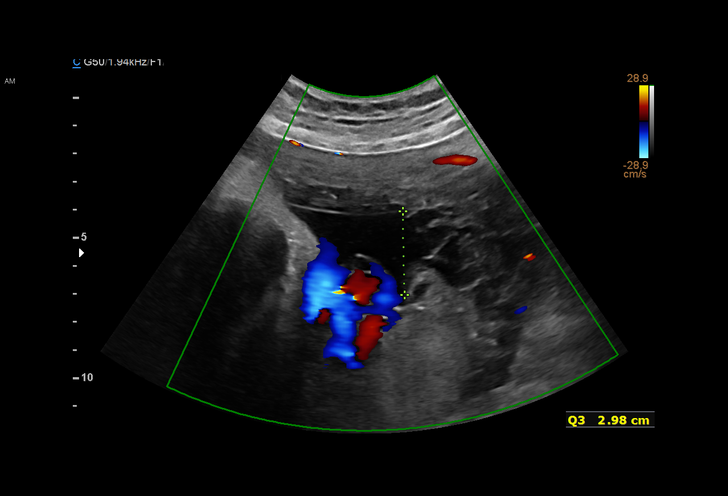

[13 of 28 positions shown; findings below may reference images not displayed]

[REDACTED]

Indications

 34 weeks gestation of pregnancy
 Placental abnormality complicating
 pregnancy, third trimester
 AFP Neg; LR NIPS
 Hx HENKE
 Greg interval between pregancies, 3rd
 trimester
 Maternal viral disease, antepartum,
 Parvovirus and B19 infection
Fetal Evaluation

 Num Of Fetuses:         1
 Fetal Heart Rate(bpm):  164
 Cardiac Activity:       Observed
 Presentation:           Cephalic
 Placenta:               Posterior
 P. Cord Insertion:      Previously Visualized

 Amniotic Fluid
 AFI FV:      Within normal limits

 AFI Sum(cm)     %Tile       Largest Pocket(cm)
 10.3            21

 RUQ(cm)       RLQ(cm)       LUQ(cm)        LLQ(cm)
 0             3.1           4.2            3
Biometry

 BPD:        85  mm     G. Age:  34w 2d         55  %    CI:        75.89   %    70 - 86
                                                         FL/HC:      19.9   %    19.4 -
 HC:      309.3  mm     G. Age:  34w 4d         27  %    HC/AC:      0.98        0.96 -
 AC:      316.3  mm     G. Age:  35w 4d         90  %    FL/BPD:     72.4   %    71 - 87
 FL:       61.5  mm     G. Age:  31w 6d        3.9  %    FL/AC:      19.4   %    20 - 24

 LV:        3.9  mm

 Est. FW:    4030  gm      5 lb 5 oz     54  %
OB History

 Gravidity:    5         Term:   2        Prem:   0        SAB:   2
 TOP:          0       Ectopic:  0        Living: 2
Gestational Age

 LMP:           34w 0d        Date:  06/22/21                 EDD:   03/29/22
 U/S Today:     34w 1d                                        EDD:   03/28/22
 Best:          34w 0d     Det. By:  LMP  (06/22/21)          EDD:   03/29/22
Anatomy

 Cranium:               Appears normal         Aortic Arch:            Previously seen
 Cavum:                 Appears normal         Ductal Arch:            Previously seen
 Ventricles:            Appears normal         Diaphragm:              Appears normal
 Choroid Plexus:        Previously seen        Stomach:                Appears normal, left
                                                                       sided
 Cerebellum:            Previously seen        Abdomen:                Appears normal
 Posterior Fossa:       Appears normal         Abdominal Wall:         Previously seen
 Nuchal Fold:           Not applicable (>20    Cord Vessels:           Previously seen
                        wks GA)
 Face:                  Orbits and profile     Kidneys:                Appear normal
                        previously seen
 Lips:                  Previously seen        Bladder:                Appears normal
 Thoracic:              Appears normal         Spine:                  Previously seen
 Heart:                 Appears normal         Upper Extremities:      Previously seen
                        (4CH, axis, and
                        situs)
 RVOT:                  Previously seen        Lower Extremities:      Previously seen
 LVOT:                  Previously seen

 Other:  VC, 3VV and 3VTV, nasal bone, lenses, maxilla, mandible and falx
         previously visualized as well as Heels/feet and open hands/5th digits.
Cervix Uterus Adnexa

 Cervix
 Not visualized (advanced GA >48wks)

 Uterus
 No abnormality visualized.

 Right Ovary
 Not visualized.
 Left Ovary
 Not visualized.
Impression

 Patient returned for fetal growth assessment.  On previous
 ultrasound a placental mass consistent with chorioangioma
 was seen.  On subsequent scans, placental mass reduced
 significantly in size.

 Fetal growth is appropriate for gestational age.  Amniotic fluid
 is normal and good fetal activity seen.  An irregular
 heterogeneous placental mass measuring 2.2 x 2.5 x 2.4 cm
 was seen (smaller than seen on previous ultrasound).
 I reassured the patient of the findings.  Blood pressure today
 at her office is 104/65 mmHg.
 She does not have gestational diabetes.
Recommendations

 -Follow-up scans as clinically indicated.
                 Pasir Mas, [place]

## 2023-03-07 ENCOUNTER — Ambulatory Visit (INDEPENDENT_AMBULATORY_CARE_PROVIDER_SITE_OTHER): Payer: Medicaid Other | Admitting: Obstetrics and Gynecology

## 2023-03-07 ENCOUNTER — Other Ambulatory Visit (HOSPITAL_COMMUNITY)
Admission: RE | Admit: 2023-03-07 | Discharge: 2023-03-07 | Disposition: A | Discharge status: Home / Self Care | Payer: Medicaid Other | Source: Ambulatory Visit | Attending: Obstetrics and Gynecology | Admitting: Obstetrics and Gynecology

## 2023-03-07 ENCOUNTER — Encounter: Payer: Self-pay | Admitting: Obstetrics and Gynecology

## 2023-03-07 VITALS — BP 97/66 | HR 85 | Ht 66.0 in | Wt 200.1 lb

## 2023-03-07 DIAGNOSIS — Z01419 Encounter for gynecological examination (general) (routine) without abnormal findings: Secondary | ICD-10-CM | POA: Insufficient documentation

## 2023-03-07 DIAGNOSIS — Z124 Encounter for screening for malignant neoplasm of cervix: Secondary | ICD-10-CM | POA: Insufficient documentation

## 2023-03-07 NOTE — Progress Notes (Signed)
HPI:      [name] is a 32 y.o. [id number] who LMP was Patient's last menstrual period was 02/06/2023.  Subjective:   She presents today for her annual examination.  She is having normal regular cycles somewhat heavy and lasting 7 days.  She is not using anything for birth control at this time but does not want to become pregnant.  She is interested in birth control.  She states that she had difficulty using OCPs before because she did not remember them well.    Hx: The following portions of the patient's history were reviewed and updated as appropriate:             She  has a past medical history of Anemia and History of herpes genitalis (09/18/2020). She does not have any pertinent problems on file. She  has a past surgical history that includes Wisdom tooth extraction. Her family history includes Breast cancer in her paternal grandmother; Cervical cancer in her maternal grandmother; Healthy in her father and mother. She  reports that she has never smoked. She has been exposed to tobacco smoke. She has never used smokeless tobacco. She reports current alcohol use. She reports that she does not use drugs. She has a current medication list which includes the following Facility-Administered Medications: medroxyprogesterone. She is allergic to penicillins.       Review of Systems:  Review of Systems  Constitutional: Denied constitutional symptoms, night sweats, recent illness, fatigue, fever, insomnia and weight loss.  Eyes: Denied eye symptoms, eye pain, photophobia, vision change and visual disturbance.  Ears/Nose/Throat/Neck: Denied ear, nose, throat or neck symptoms, hearing loss, nasal discharge, sinus congestion and sore throat.  Cardiovascular: Denied cardiovascular symptoms, arrhythmia, chest pain/pressure, edema, exercise intolerance, orthopnea and palpitations.  Respiratory: Denied pulmonary symptoms, asthma, pleuritic pain, productive sputum, cough, dyspnea and wheezing.   Gastrointestinal: Denied, gastro-esophageal reflux, melena, nausea and vomiting.  Genitourinary: Denied genitourinary symptoms including symptomatic vaginal discharge, pelvic relaxation issues, and urinary complaints.  Musculoskeletal: Denied musculoskeletal symptoms, stiffness, swelling, muscle weakness and myalgia.  Dermatologic: Denied dermatology symptoms, rash and scar.  Neurologic: Denied neurology symptoms, dizziness, headache, neck pain and syncope.  Psychiatric: Denied psychiatric symptoms, anxiety and depression.  Endocrine: Denied endocrine symptoms including hot flashes and night sweats.   Meds:   No current outpatient medications on file prior to visit.   Current Facility-Administered Medications on File Prior to Visit  Medication Dose Route Frequency Provider Last Rate Last Admin   medroxyPROGESTERone (DEPO-PROVERA) injection 150 mg  150 mg Intramuscular Once Linzie Collin, MD         Objective:     Vitals:   03/07/23 1331  BP: 97/66  Pulse: 85    Filed Weights   03/07/23 1331  Weight: 200 lb 1.6 oz (90.8 kg)              Physical examination General NAD, Conversant  HEENT Atraumatic; Op clear with mmm.  Normo-cephalic. Pupils reactive. Anicteric sclerae  Thyroid/Neck Smooth without nodularity or enlargement. Normal ROM.  Neck Supple.  Skin No rashes, lesions or ulceration. Normal palpated skin turgor. No nodularity.  Breasts: No masses or discharge.  Symmetric.  No axillary adenopathy.  Lungs: Clear to auscultation.No rales or wheezes. Normal Respiratory effort, no retractions.  Heart: NSR.  No murmurs or rubs appreciated. No peripheral edema  Abdomen: Soft.  Non-tender.  No masses.  No HSM. No hernia  Extremities: Moves all appropriately.  Normal ROM for age. No lymphadenopathy.  Neuro: Oriented to PPT.  Normal mood. Normal affect.     Pelvic:   Vulva: Normal appearance.  No lesions.  Vagina: No lesions or abnormalities noted.  Support: Normal  pelvic support.  Urethra No masses tenderness or scarring.  Meatus Normal size without lesions or prolapse.  Cervix: Normal appearance.  No lesions.  Anus: Normal exam.  No lesions.  Perineum: Normal exam.  No lesions.        Bimanual   Uterus: Normal size.  Non-tender.  Mobile.  AV.  Adnexae: No masses.  Non-tender to palpation.  Cul-de-sac: Negative for abnormality.     Assessment:    [id number] Patient Active Problem List   Diagnosis Date Noted   Term pregnancy 03/26/2022   Parvovirus B19 infection complicating pregnancy 10/01/2021   Short interval between pregnancies affecting pregnancy in second trimester, antepartum 10/01/2021   History of herpes genitalis 09/18/2020   UTI in pregnancy, antepartum, first trimester 04/07/2020     1. Well woman exam with routine gynecological exam   2. Cervical cancer screening     Normal exam  Patient desires birth control    Plan:            1.  Basic Screening Recommendations The basic screening recommendations for asymptomatic women were discussed with the patient during her visit.  The age-appropriate recommendations were discussed with her and the rational for the tests reviewed.  When I am informed by the patient that another primary care physician has previously obtained the age-appropriate tests and they are up-to-date, only outstanding tests are ordered and referrals given as necessary.  Abnormal results of tests will be discussed with her when all of her results are completed.  Routine preventative health maintenance measures emphasized: Exercise/Diet/Weight control, Tobacco Warnings, Alcohol/Substance use risks and Stress Management  Pap performed.  2.  Birth control methods discussed.  Patient desires IUD.   Orders No orders of the defined types were placed in this encounter.   No orders of the defined types were placed in this encounter.         F/U  Return in about 1 week (around 03/14/2023).  Elonda Husky,  M.D. 03/07/2023 1:56 PM

## 2023-03-07 NOTE — Progress Notes (Signed)
Patients presents for annual exam today. She states having regular cycles but would like to discuss birth control options, open to IUD or OCP's. She is due for pap smear, ordered. She states no other questions or concerns at this time.

## 2023-03-13 LAB — CYTOLOGY - PAP
Comment: NEGATIVE
Diagnosis: UNDETERMINED — AB
High risk HPV: NEGATIVE

## 2023-03-15 ENCOUNTER — Ambulatory Visit: Payer: Medicaid Other | Admitting: Obstetrics and Gynecology

## 2023-03-15 DIAGNOSIS — Z30430 Encounter for insertion of intrauterine contraceptive device: Secondary | ICD-10-CM

## 2023-05-18 ENCOUNTER — Ambulatory Visit (INDEPENDENT_AMBULATORY_CARE_PROVIDER_SITE_OTHER): Payer: Medicaid Other | Admitting: Licensed Practical Nurse

## 2023-05-18 ENCOUNTER — Encounter: Payer: Self-pay | Admitting: Licensed Practical Nurse

## 2023-05-18 VITALS — BP 112/73 | HR 71 | Wt 198.2 lb

## 2023-05-18 DIAGNOSIS — Z3202 Encounter for pregnancy test, result negative: Secondary | ICD-10-CM

## 2023-05-18 DIAGNOSIS — Z30430 Encounter for insertion of intrauterine contraceptive device: Secondary | ICD-10-CM

## 2023-05-18 LAB — POCT URINE PREGNANCY: Preg Test, Ur: NEGATIVE

## 2023-05-18 MED ORDER — LEVONORGESTREL 20 MCG/DAY IU IUD
1.0000 | INTRAUTERINE_SYSTEM | Freq: Once | INTRAUTERINE | Status: AC
  Administered 2023-05-18: 1 via INTRAUTERINE

## 2023-05-18 NOTE — Progress Notes (Signed)
   GYNECOLOGY OFFICE PROCEDURE NOTE  Wanda Richmond is a 32 y.o. [id number] here for  a Mirena IUD insertion. No GYN concerns.  Last pap smear was on 02/2023 and was ASCUS HPV neg.  The patient is currently using none for contraception and her LMP is Patient's last menstrual period was 05/17/2023 (approximate)..  The indication for her IUD is contraception/cycle control.  May desire another child in the future, has a 55, 30 and 83 year old at home.   IUD Insertion Procedure Note Patient identified, informed consent performed, consent signed.   Discussed risks of irregular bleeding, cramping, infection, malpositioning, expulsion or uterine perforation of the IUD (1:1000 placements)  which may require further procedure such as laparoscopy.  IUD while effective at preventing pregnancy do not prevent transmission of sexually transmitted diseases and use of barrier methods for this purpose was discussed. Time out was performed.  Urine pregnancy test negative.  Speculum placed in the vagina.  Cervix visualized.  Cleaned with Betadine x 3.  Grasped anteriorly with a single tooth tenaculum.  Uterus sounded to 5 cm. IUD placed per manufacturer's recommendations.  Strings trimmed to 3 cm. Tenaculum was removed, good hemostasis noted.  Patient tolerated procedure well.   Patient was given post-procedure instructions.  She was advised to have backup contraception for one week.  Patient was also asked to check IUD strings periodically and follow up in 6 weeks for IUD check.   Carie Caddy, CNM  [facility]

## 2023-06-04 NOTE — Progress Notes (Signed)
Pcp, No   Chief Complaint  Patient presents with   IUD removal    Due to severe cramping and the irregular bleeding.     HPI:      [name] is a 32 y.o. [id number] whose LMP was Patient's last menstrual period was 05/17/2023 (approximate)., presents today for IUD f/u. Mirena placed 05/18/23. Has had light spotting since placement, resolved yesterday, and mod dysmen, no meds taken but feels like labor pains. Dysmen is starting to improve as well. Pt would like to give IUD a chance to work but concerned about sx. Did depo in past.    Patient Active Problem List   Diagnosis Date Noted   Parvovirus B19 infection complicating pregnancy 10/01/2021   History of herpes genitalis 09/18/2020    Past Surgical History:  Procedure Laterality Date   WISDOM TOOTH EXTRACTION      Family History  Problem Relation Age of Onset   Healthy Mother    Healthy Father    Cervical cancer Maternal Grandmother    Breast cancer Paternal Grandmother    Colon cancer Neg Hx    Ovarian cancer Neg Hx     Social History   Socioeconomic History   Marital status: Married    Spouse name: Gregary Signs   Number of children: Not on file   Years of education: Not on file   Highest education level: Not on file  Occupational History   Occupation: Energy manager    Comment: [employer]  Tobacco Use   Smoking status: Never    Passive exposure: Current   Smokeless tobacco: Never  Vaping Use   Vaping status: Never Used  Substance and Sexual Activity   Alcohol use: Yes    Comment: socially   Drug use: Never   Sexual activity: Yes    Birth control/protection: I.U.D.  Other Topics Concern   Not on file  Social History Narrative   Not on file   Social Determinants of Health   Financial Resource Strain: Not on file  Food Insecurity: Not on file  Transportation Needs: Not on file  Physical Activity: Not on file  Stress: Not on file  Social Connections: Not on file  Intimate  Partner Violence: Not on file    Outpatient Medications Prior to Visit  Medication Sig Dispense Refill   levonorgestrel (MIRENA) 20 MCG/DAY IUD 1 each by Intrauterine route once.     No facility-administered medications prior to visit.      ROS:  Review of Systems  Constitutional:  Negative for fever.  Gastrointestinal:  Negative for blood in stool, constipation, diarrhea, nausea and vomiting.  Genitourinary:  Positive for pelvic pain and vaginal bleeding. Negative for dyspareunia, dysuria, flank pain, frequency, hematuria, urgency, vaginal discharge and vaginal pain.  Musculoskeletal:  Negative for back pain.  Skin:  Negative for rash.   BREAST: No symptoms   OBJECTIVE:   Vitals:  BP 110/60   Ht 5\' 7"  (1.702 m)   Wt 201 lb (91.2 kg)   LMP 05/17/2023 (Approximate)   Breastfeeding No   BMI 31.48 kg/m   Physical Exam Vitals reviewed.  Constitutional:      Appearance: She is well-developed.  Pulmonary:     Effort: Pulmonary effort is normal.  Genitourinary:    General: Normal vulva.     Pubic Area: No rash.      Labia:        Right: No rash, tenderness or lesion.  Left: No rash, tenderness or lesion.      Vagina: Normal. No vaginal discharge, erythema or tenderness.     Cervix: Normal.     Uterus: Normal. Not enlarged and not tender.      Adnexa: Right adnexa normal and left adnexa normal.       Right: No mass or tenderness.         Left: No mass or tenderness.       Comments: IUD STRINGS IN CX OS Musculoskeletal:        General: Normal range of motion.     Cervical back: Normal range of motion.  Skin:    General: Skin is warm and dry.  Neurological:     General: No focal deficit present.     Mental Status: She is alert and oriented to person, place, and time.  Psychiatric:        Mood and Affect: Mood normal.        Behavior: Behavior normal.        Thought Content: Thought content normal.        Judgment: Judgment normal.      Assessment/Plan: Encounter for routine checking of intrauterine contraceptive device (IUD)--IUD strings in cx os. Neg exam.   Breakthrough bleeding with IUD--BTB and dysmen improving. Pt would like to keep IUD for now and give it more time. Ibup prn dysmen. F/u prn.     Return if symptoms worsen or fail to improve.  Shakevia Sarris B. Cyla Haluska, PA-C 06/05/2023 3:17 PM

## 2023-06-05 ENCOUNTER — Encounter: Payer: Self-pay | Admitting: Obstetrics and Gynecology

## 2023-06-05 ENCOUNTER — Ambulatory Visit (INDEPENDENT_AMBULATORY_CARE_PROVIDER_SITE_OTHER): Payer: Medicaid Other | Admitting: Obstetrics and Gynecology

## 2023-06-05 VITALS — BP 110/60 | Ht 67.0 in | Wt 201.0 lb

## 2023-06-05 DIAGNOSIS — N921 Excessive and frequent menstruation with irregular cycle: Secondary | ICD-10-CM | POA: Diagnosis not present

## 2023-06-05 DIAGNOSIS — Z30431 Encounter for routine checking of intrauterine contraceptive device: Secondary | ICD-10-CM

## 2023-06-05 NOTE — Patient Instructions (Signed)
I value your feedback and you entrusting us with your care. If you get a [facility] patient survey, I would appreciate you taking the time to let us know about your experience today. Thank you! ? ? ?

## 2023-09-26 ENCOUNTER — Ambulatory Visit (INDEPENDENT_AMBULATORY_CARE_PROVIDER_SITE_OTHER): Payer: Medicaid Other | Admitting: Obstetrics and Gynecology

## 2023-09-26 ENCOUNTER — Encounter: Payer: Self-pay | Admitting: Obstetrics and Gynecology

## 2023-09-26 VITALS — BP 106/78 | Ht 67.0 in | Wt 200.0 lb

## 2023-09-26 DIAGNOSIS — N898 Other specified noninflammatory disorders of vagina: Secondary | ICD-10-CM | POA: Diagnosis not present

## 2023-09-26 DIAGNOSIS — Z30432 Encounter for removal of intrauterine contraceptive device: Secondary | ICD-10-CM

## 2023-09-26 DIAGNOSIS — Z30011 Encounter for initial prescription of contraceptive pills: Secondary | ICD-10-CM

## 2023-09-26 DIAGNOSIS — Z113 Encounter for screening for infections with a predominantly sexual mode of transmission: Secondary | ICD-10-CM

## 2023-09-26 LAB — POCT WET PREP WITH KOH
KOH Prep POC: NEGATIVE
Trichomonas, UA: NEGATIVE
Yeast Wet Prep HPF POC: NEGATIVE

## 2023-09-26 MED ORDER — MICROGESTIN 24 FE 1-20 MG-MCG PO TABS: 1.0000 | ORAL_TABLET | Freq: Every day | ORAL | 1 refills | Status: AC

## 2023-09-26 MED ORDER — CLINDAMYCIN HCL 300 MG PO CAPS: 300.0000 mg | ORAL_CAPSULE | Freq: Two times a day (BID) | ORAL | 0 refills | Status: AC

## 2023-09-26 NOTE — Progress Notes (Signed)
Pcp, No   Chief Complaint  Patient presents with   Contraception    IUD removal start BC pills. Cramping during cycle severe since insertion.   Vaginal Odor    Sour, unsure of discharge, no itching x 1 week    HPI:      [name] is a 32 y.o. [id number] whose LMP was Patient's last menstrual period was 09/22/2023 (approximate)., presents today for IUD removal due to dysmen since placement 05/18/23. Has random light to mod bleeding, with increased dysmen. Takes ibup with some relief. Would like go back to OCPs; did well with them in past. No hx of HTN, DVTs, migraines with aura.  Pt complains of vaginal odor without itching/d/c for the past week.  Hx of BV in past, confirmed on culture (atopobopium, BVAB 2, and megasphaera); pt treated with leftover flagly BID for 3 days last wk but sx persisting. No urin sx, no LBP, pelvic pain, fevers.  Infrequently sexually active, would like STD testing today.   Annual due 4/25  Patient Active Problem List   Diagnosis Date Noted   Parvovirus B19 infection complicating pregnancy 10/01/2021   History of herpes genitalis 09/18/2020    Past Surgical History:  Procedure Laterality Date   WISDOM TOOTH EXTRACTION      Family History  Problem Relation Age of Onset   Healthy Mother    Healthy Father    Cervical cancer Maternal Grandmother    Breast cancer Paternal Grandmother    Colon cancer Neg Hx    Ovarian cancer Neg Hx     Social History   Socioeconomic History   Marital status: Married    Spouse name: Gregary Signs   Number of children: Not on file   Years of education: Not on file   Highest education level: Not on file  Occupational History   Occupation: Energy manager    Comment: [employer]  Tobacco Use   Smoking status: Never    Passive exposure: Current   Smokeless tobacco: Never  Vaping Use   Vaping status: Never Used  Substance and Sexual Activity   Alcohol use: Not Currently   Drug use: Never    Sexual activity: Yes    Birth control/protection: I.U.D.    Comment: Mirena  Other Topics Concern   Not on file  Social History Narrative   Not on file   Social Determinants of Health   Financial Resource Strain: Not on file  Food Insecurity: Not on file  Transportation Needs: Not on file  Physical Activity: Not on file  Stress: Not on file  Social Connections: Not on file  Intimate Partner Violence: Not on file    Outpatient Medications Prior to Visit  Medication Sig Dispense Refill   levonorgestrel (MIRENA) 20 MCG/DAY IUD 1 each by Intrauterine route once.     No facility-administered medications prior to visit.      ROS:  Review of Systems  Constitutional:  Negative for fever.  Gastrointestinal:  Negative for blood in stool, constipation, diarrhea, nausea and vomiting.  Genitourinary:  Positive for vaginal discharge. Negative for dyspareunia, dysuria, flank pain, frequency, hematuria, urgency, vaginal bleeding and vaginal pain.  Musculoskeletal:  Negative for back pain.  Skin:  Negative for rash.   BREAST: No symptoms   OBJECTIVE:   Vitals:  BP 106/78   Ht 5\' 7"  (1.702 m)   Wt 200 lb (90.7 kg)   LMP 09/22/2023 (Approximate)   Breastfeeding No   BMI 31.32 kg/m  Physical Exam Vitals reviewed.  Constitutional:      Appearance: She is well-developed.  Pulmonary:     Effort: Pulmonary effort is normal.  Genitourinary:    General: Normal vulva.     Pubic Area: No rash.      Labia:        Right: No rash, tenderness or lesion.        Left: No rash, tenderness or lesion.      Vagina: Normal. No vaginal discharge, erythema or tenderness.     Cervix: Normal.     Uterus: Normal. Not enlarged and not tender.      Adnexa: Right adnexa normal and left adnexa normal.       Right: No mass or tenderness.         Left: No mass or tenderness.       Comments: IUD STRINGS IN CX OS Musculoskeletal:        General: Normal range of motion.     Cervical back: Normal  range of motion.  Skin:    General: Skin is warm and dry.  Neurological:     General: No focal deficit present.     Mental Status: She is alert and oriented to person, place, and time.  Psychiatric:        Mood and Affect: Mood normal.        Behavior: Behavior normal.        Thought Content: Thought content normal.        Judgment: Judgment normal.     IUD Removal Strings of IUD identified and grasped.  IUD removed without problem with ring forceps.  Pt tolerated this well.  IUD noted to be intact.   Results: Results for orders placed or performed in visit on 09/26/23 (from the past 24 hour(s))  POCT Wet Prep with KOH     Status: Abnormal   Collection Time: 09/26/23 12:10 PM  Result Value Ref Range   Trichomonas, UA Negative    Clue Cells Wet Prep HPF POC few    Epithelial Wet Prep HPF POC     Yeast Wet Prep HPF POC neg    Bacteria Wet Prep HPF POC     RBC Wet Prep HPF POC     WBC Wet Prep HPF POC     KOH Prep POC Negative Negative     Assessment/Plan: Vaginal odor - Plan: POCT Wet Prep with KOH, clindamycin (CLEOCIN) 300 MG capsule, NuSwab Vaginitis Plus (VG+); few clue cells on wet prep; pt had treated with leftover flagyl for 3 days. Try Rx clindamcyin since hx of atopobium on culture in past. Check culture. F/u prn.   Screening for STD (sexually transmitted disease) - Plan: NuSwab Vaginitis Plus (VG+)  Encounter for initial prescription of contraceptive pills - Plan: Norethindrone Acetate-Ethinyl Estrad-FE (MICROGESTIN 24 FE) 1-20 MG-MCG(24) tablet; BC options discussed. Pt would like OCPs, Rx eRxd. Start today, condoms for 7 days. F/u prn.   Encounter for IUD removal   Meds ordered this encounter  Medications   clindamycin (CLEOCIN) 300 MG capsule    Sig: Take 1 capsule (300 mg total) by mouth 2 (two) times daily for 7 days.    Dispense:  14 capsule    Refill:  0    Order Specific Question:   Supervising Provider    Answer:   Hildred Laser [[id number]]    Norethindrone Acetate-Ethinyl Estrad-FE (MICROGESTIN 24 FE) 1-20 MG-MCG(24) tablet    Sig: Take 1 tablet by mouth daily.  Dispense:  84 tablet    Refill:  1    Order Specific Question:   Supervising Provider    Answer:   Hildred Laser [[id number]]      Return if symptoms worsen or fail to improve.  Frances Ambrosino B. Case Vassell, PA-C 09/26/2023 12:12 PM

## 2023-09-26 NOTE — Patient Instructions (Signed)
I value your feedback and you entrusting us with your care. If you get a [facility] patient survey, I would appreciate you taking the time to let us know about your experience today. Thank you! ? ? ?

## 2023-09-27 ENCOUNTER — Telehealth: Payer: Self-pay

## 2023-09-27 NOTE — Telephone Encounter (Signed)
Pt calling; states rx for her bc was supposed to have been sent in yesterday but pharm says they didn't receive it.  Adv I would check on it.  Called [facility] on [address], spoke c Loraine Leriche who states rx was filled yesterday and is ready to p/u.  Pt aware by vm.

## 2023-09-29 LAB — NUSWAB VAGINITIS PLUS (VG+)
Atopobium vaginae: HIGH {score} — AB
BVAB 2: HIGH {score} — AB
Candida albicans, NAA: NEGATIVE
Candida glabrata, NAA: NEGATIVE
Chlamydia trachomatis, NAA: NEGATIVE
Megasphaera 1: HIGH {score} — AB
Neisseria gonorrhoeae, NAA: NEGATIVE
Trich vag by NAA: NEGATIVE

## 2024-03-09 ENCOUNTER — Other Ambulatory Visit: Payer: Self-pay | Admitting: Obstetrics and Gynecology

## 2024-03-09 DIAGNOSIS — Z30011 Encounter for initial prescription of contraceptive pills: Secondary | ICD-10-CM

## 2024-04-17 ENCOUNTER — Ambulatory Visit (INDEPENDENT_AMBULATORY_CARE_PROVIDER_SITE_OTHER): Admitting: Obstetrics and Gynecology

## 2024-04-17 ENCOUNTER — Encounter: Payer: Self-pay | Admitting: Obstetrics and Gynecology

## 2024-04-17 ENCOUNTER — Other Ambulatory Visit (HOSPITAL_COMMUNITY)
Admission: RE | Admit: 2024-04-17 | Discharge: 2024-04-17 | Disposition: A | Discharge status: Home / Self Care | Source: Ambulatory Visit | Attending: Obstetrics and Gynecology | Admitting: Obstetrics and Gynecology

## 2024-04-17 VITALS — BP 110/76 | HR 63 | Ht 66.0 in | Wt 199.2 lb

## 2024-04-17 DIAGNOSIS — Z01419 Encounter for gynecological examination (general) (routine) without abnormal findings: Secondary | ICD-10-CM

## 2024-04-17 DIAGNOSIS — Z113 Encounter for screening for infections with a predominantly sexual mode of transmission: Secondary | ICD-10-CM | POA: Insufficient documentation

## 2024-04-17 NOTE — Progress Notes (Signed)
 HPI:      [name] is a 33 y.o. [id number] who LMP was Patient's last menstrual period was 04/07/2024 (exact date).  Subjective:   She presents today for her annual examination.  She has no complaints.  She is not currently sexually active.  She is having normal regular menses.  She does desire annual STD testing.    Hx: The following portions of the patient's history were reviewed and updated as appropriate:             She  has a past medical history of Anemia and History of herpes genitalis (09/18/2020). She does not have any pertinent problems on file. She  has a past surgical history that includes Wisdom tooth extraction. Her family history includes Breast cancer in her paternal grandmother; Cervical cancer in her maternal grandmother; Healthy in her father and mother. She  reports that she has never smoked. She has been exposed to tobacco smoke. She has never used smokeless tobacco. She reports that she does not currently use alcohol. She reports that she does not use drugs. She has a current medication list which includes the following prescription(s): microgestin  24 fe. She is allergic to penicillins.       Review of Systems:  Review of Systems  Constitutional: Denied constitutional symptoms, night sweats, recent illness, fatigue, fever, insomnia and weight loss.  Eyes: Denied eye symptoms, eye pain, photophobia, vision change and visual disturbance.  Ears/Nose/Throat/Neck: Denied ear, nose, throat or neck symptoms, hearing loss, nasal discharge, sinus congestion and sore throat.  Cardiovascular: Denied cardiovascular symptoms, arrhythmia, chest pain/pressure, edema, exercise intolerance, orthopnea and palpitations.  Respiratory: Denied pulmonary symptoms, asthma, pleuritic pain, productive sputum, cough, dyspnea and wheezing.  Gastrointestinal: Denied, gastro-esophageal reflux, melena, nausea and vomiting.  Genitourinary: Denied genitourinary symptoms including symptomatic  vaginal discharge, pelvic relaxation issues, and urinary complaints.  Musculoskeletal: Denied musculoskeletal symptoms, stiffness, swelling, muscle weakness and myalgia.  Dermatologic: Denied dermatology symptoms, rash and scar.  Neurologic: Denied neurology symptoms, dizziness, headache, neck pain and syncope.  Psychiatric: Denied psychiatric symptoms, anxiety and depression.  Endocrine: Denied endocrine symptoms including hot flashes and night sweats.   Meds:   Current Outpatient Medications on File Prior to Visit  Medication Sig Dispense Refill   Norethindrone Acetate-Ethinyl Estrad-FE (MICROGESTIN  24 FE) 1-20 MG-MCG(24) tablet Take 1 tablet by mouth daily. (Patient not taking: Reported on 04/17/2024) 84 tablet 1   No current facility-administered medications on file prior to visit.     Objective:      Vitals:   04/17/24 0929  BP: 110/76  Pulse: 63    Filed Weights   04/17/24 0929  Weight: 199 lb 3.2 oz (90.4 kg)              Physical examination General NAD, Conversant  HEENT Atraumatic; Op clear with mmm.  Normo-cephalic.  Anicteric sclerae  Thyroid/Neck Smooth without nodularity or enlargement. Normal ROM.  Neck Supple.  Skin No rashes, lesions or ulceration. Normal palpated skin turgor. No nodularity.  Breasts: No masses or discharge.  Symmetric.  No axillary adenopathy.  Lungs: Clear to auscultation.No rales or wheezes. Normal Respiratory effort, no retractions.  Heart: NSR.  No murmurs or rubs appreciated. No peripheral edema  Abdomen: Soft.  Non-tender.  No masses.  No HSM. No hernia  Extremities: Moves all appropriately.  Normal ROM for age. No lymphadenopathy.  Neuro: Oriented to PPT.  Normal mood. Normal affect.     Pelvic: Deferred by patient.    Assessment:  [id number] Patient Active Problem List   Diagnosis Date Noted   Parvovirus B19 infection complicating pregnancy 10/01/2021   History of herpes genitalis 09/18/2020     1. Encounter for well  woman exam   2. Screening for STD (sexually transmitted disease)     Normal exam   Plan:            1.  Basic Screening Recommendations The basic screening recommendations for asymptomatic women were discussed with the patient during her visit.  The age-appropriate recommendations were discussed with her and the rational for the tests reviewed.  When I am informed by the patient that another primary care physician has previously obtained the age-appropriate tests and they are up-to-date, only outstanding tests are ordered and referrals given as necessary.  Abnormal results of tests will be discussed with her when all of her results are completed.  Routine preventative health maintenance measures emphasized: Exercise/Diet/Weight control, Tobacco Warnings, Alcohol/Substance use risks and Stress Management Nuswab performed at patient request -blood work ordered Orders Orders Placed This Encounter  Procedures   Hepatitis B surface antigen   Hepatitis C antibody   HIV Antibody (routine testing w rflx)   RPR    No orders of the defined types were placed in this encounter.        F/U  Return in about 1 year (around 04/17/2025) for Annual Physical, We will contact her with any abnormal test results.  Delice Felt, M.D. 04/17/2024 10:01 AM

## 2024-04-18 LAB — CERVICOVAGINAL ANCILLARY ONLY
Bacterial Vaginitis (gardnerella): POSITIVE — AB
Candida Glabrata: NEGATIVE
Candida Vaginitis: NEGATIVE
Chlamydia: NEGATIVE
Comment: NEGATIVE
Comment: NEGATIVE
Comment: NEGATIVE
Comment: NEGATIVE
Comment: NEGATIVE
Comment: NORMAL
Neisseria Gonorrhea: NEGATIVE
Trichomonas: NEGATIVE

## 2024-04-18 LAB — RPR: RPR Ser Ql: NONREACTIVE

## 2024-04-18 LAB — HIV ANTIBODY (ROUTINE TESTING W REFLEX): HIV Screen 4th Generation wRfx: NONREACTIVE

## 2024-04-18 LAB — HEPATITIS C ANTIBODY: Hep C Virus Ab: NONREACTIVE

## 2024-04-18 LAB — HEPATITIS B SURFACE ANTIGEN: Hepatitis B Surface Ag: NEGATIVE

## 2024-04-22 ENCOUNTER — Ambulatory Visit: Payer: Self-pay

## 2024-04-22 DIAGNOSIS — B9689 Other specified bacterial agents as the cause of diseases classified elsewhere: Secondary | ICD-10-CM

## 2024-04-22 MED ORDER — METRONIDAZOLE 500 MG PO TABS: 500.0000 mg | ORAL_TABLET | Freq: Two times a day (BID) | ORAL | 0 refills | Status: DC

## 2024-09-23 NOTE — Progress Notes (Unsigned)
    NURSE VISIT NOTE  Subjective:    Patient ID: Wanda Richmond, female    DOB: 1991-07-18, 33 y.o.   MRN: [id number]  HPI  [name] who presents for {pe vag discharge desc:315065} vaginal discharge for *** {gen duration:315003}. Denies abnormal vaginal bleeding or significant pelvic pain or fever. {Actions; denies/reports/admits to:19208} {UTI Symptoms:210800002}. Patient {has/denies:33800} history of known exposure to STD.   Objective:    There were no vitals taken for this visit.   No results found for any visits on 09/24/24.  Assessment:   1. Vaginal odor     {vaginitis type:315262}  Plan:   GC and chlamydia DNA  probe sent to lab. Treatment: {vaginitis tx:315263} ROV prn if symptoms persist or worsen.   Camelia LITTIE Fetters, CMA

## 2024-09-23 NOTE — Patient Instructions (Signed)
 Vaginal Infection (Bacterial Vaginosis): What to Know  Bacterial vaginosis is an infection of the vagina. It happens when the balance of normal germs (bacteria) in the vagina changes. If you don't get treated, it can make it easier for you to get other infections from sex. These are called sexually transmitted infections (STIs). If you're pregnant, you need to get treated right away. This infection can cause a baby to be born early or at a low birth weight. What are the causes? This infection happens when too many harmful germs grow in the vagina. You can't get this infection from toilet seats, bedsheets, swimming pools, or things that touch your vagina. What increases the risk? Having sex with a new person or more than one person. Having sex without protection. Douching. Having an intrauterine device (IUD). Smoking. Using drugs or drinking alcohol. These can lead you to do risky things. Taking certain antibiotics. Being pregnant. What are the signs or symptoms? Some females have no symptoms. Symptoms may include: A gray or white discharge from your vagina. It can be watery or foamy. A fishy smell. This can happen after sex or during your menstrual period. Itching in and around your vagina. Burning or pain when you pee. How is this treated? This infection is treated with antibiotics. These may be given to you as: A pill. A cream for your vagina. A medicine that you put into your vagina (suppository). If the infection comes back, you may need more antibiotics. Follow these instructions at home: Medicines Take your medicines as told. Take or use your antibiotics as told. Do not stop using them even if you start to feel better. General instructions If the person you have sex with is a female, tell her that you have this infection. She will need to follow up with her doctor. Female partners don't need to be treated. Do not have sex until you finish treatment. Drink more fluids as  told. Keep your vagina and butt clean. Wash these areas with warm water each day. Wipe from front to back after you poop. If you're breastfeeding a baby, talk to your doctor if you should keep doing so during treatment. How is this prevented? Self-care Do not douche. Do not use deodorant sprays on your vagina. Wear cotton underwear. Do not wear tight pants and pantyhose, especially in the summer. Safe sex Use condoms the correct way and every time you have sex. Use dental dams to protect yourself during oral sex. Limit how many people you have sex with. Get tested for STIs. The person you have sex with should also get tested. Drugs and alcohol Do not smoke, vape, or use nicotine or tobacco. Do not use drugs. Limit the amount of alcohol you drink because it can lead you to do risky things. Where to find more information To learn more: Go to TonerPromos.no. Click Health Topics A-Z. Type bacterial vaginosis in the search bar. American Sexual Health Association (ASHA): ashasexualhealth.org U.S. Department of Health and [employer], Office on Women's Health: TravelLesson.ca Contact a doctor if: Your symptoms don't get better, even after treatment. You have more discharge or pain when you pee. You have a fever or chills. You have pain in your belly or in the area between your hips. You have pain during sex. You bleed from your vagina between menstrual periods. This information is not intended to replace advice given to you by your health care provider. Make sure you discuss any questions you have with your health care provider. Document  Revised: 04/26/2023 Document Reviewed: 04/26/2023 Elsevier Patient Education  2024 [employer].

## 2024-09-24 ENCOUNTER — Ambulatory Visit (INDEPENDENT_AMBULATORY_CARE_PROVIDER_SITE_OTHER)

## 2024-09-24 ENCOUNTER — Other Ambulatory Visit (HOSPITAL_COMMUNITY)
Admission: RE | Admit: 2024-09-24 | Discharge: 2024-09-24 | Disposition: A | Discharge status: Home / Self Care | Source: Ambulatory Visit | Attending: Certified Nurse Midwife | Admitting: Certified Nurse Midwife

## 2024-09-24 VITALS — BP 120/77 | HR 90 | Resp 16 | Ht 66.0 in | Wt 193.6 lb

## 2024-09-24 DIAGNOSIS — N898 Other specified noninflammatory disorders of vagina: Secondary | ICD-10-CM | POA: Diagnosis present

## 2024-09-25 ENCOUNTER — Encounter: Payer: Self-pay | Admitting: Certified Nurse Midwife

## 2024-09-25 ENCOUNTER — Other Ambulatory Visit: Payer: Self-pay | Admitting: Certified Nurse Midwife

## 2024-09-25 DIAGNOSIS — B9689 Other specified bacterial agents as the cause of diseases classified elsewhere: Secondary | ICD-10-CM

## 2024-09-25 LAB — CERVICOVAGINAL ANCILLARY ONLY
Bacterial Vaginitis (gardnerella): POSITIVE — AB
Candida Glabrata: NEGATIVE
Candida Vaginitis: NEGATIVE
Chlamydia: NEGATIVE
Comment: NEGATIVE
Comment: NEGATIVE
Comment: NEGATIVE
Comment: NEGATIVE
Comment: NEGATIVE
Comment: NORMAL
Neisseria Gonorrhea: NEGATIVE
Trichomonas: NEGATIVE

## 2024-09-25 MED ORDER — METRONIDAZOLE 500 MG PO TABS: 500.0000 mg | ORAL_TABLET | Freq: Two times a day (BID) | ORAL | 0 refills | Status: DC

## 2024-09-26 ENCOUNTER — Ambulatory Visit: Payer: Self-pay

## 2024-10-07 ENCOUNTER — Ambulatory Visit

## 2024-10-07 NOTE — Progress Notes (Deleted)
    NURSE VISIT NOTE  Subjective:    Patient ID: Wanda Richmond, female    DOB: July 26, 1991, 33 y.o.   MRN: [id number]  HPI  [name] who presents for evaluation of amenorrhea. She believes she could be pregnant. {pregnancy desire:14500::Pregnancy is desired.} Sexual Activity: {sexual partners:705}. Current symptoms also include: {preg sx:18128}. Last period was {norm/abn:16337}.    Objective:    There were no vitals taken for this visit.  Lab Review  No results found for any visits on 10/07/24.  Assessment:   No diagnosis found.  Plan:   Pregnancy Test: Positive  Estimated Date of Delivery:06/06/2025 BP Cuff Measurement taken. Cuff Size Adult Small Encouraged well-balanced diet, plenty of rest when needed, pre-natal vitamins daily and walking for exercise.  Discussed self-help for nausea, avoiding OTC medications until consulting provider or pharmacist, other than Tylenol  as needed, minimal caffeine (1-2 cups daily) and avoiding alcohol.   She will schedule her nurse visit @ 7-[redacted] wks pregnant, u/s for dating @10  wk, and NOB visit at [redacted] wk pregnant.    Feel free to call with any questions.  {AOB - PREGNANCY PLAN:28597:p}   Rollo JINNY Maxin, CMA

## 2024-10-07 NOTE — Patient Instructions (Incomplete)
Commonly Asked Questions During Pregnancy  Cats: A parasite can be excreted in cat feces.  To avoid exposure you need to have another person empty the little box.  If you must empty the litter box you will need to wear gloves.  Wash your hands after handling your cat.  This parasite can also be found in raw or undercooked meat so this should also be avoided.  Colds, Sore Throats, Flu: Please check your medication sheet to see what you can take for symptoms.  If your symptoms are unrelieved by these medications please call the office.  Dental Work: Most any dental work your dentist recommends is permitted.  X-rays should only be taken during the first trimester if absolutely necessary.  Your abdomen should be shielded with a lead apron during all x-rays.  Please notify your provider prior to receiving any x-rays.  Novocaine is fine; gas is not recommended.  If your dentist requires a note from us prior to dental work please call the office and we will provide one for you.  Exercise: Exercise is an important part of staying healthy during your pregnancy.  You may continue most exercises you were accustomed to prior to pregnancy.  Later in your pregnancy you will most likely notice you have difficulty with activities requiring balance like riding a bicycle.  It is important that you listen to your body and avoid activities that put you at a higher risk of falling.  Adequate rest and staying well hydrated are a must!  If you have questions about the safety of specific activities ask your provider.    Exposure to Children with illness: Try to avoid obvious exposure; report any symptoms to us when noted,  If you have chicken pos, red measles or mumps, you should be immune to these diseases.   Please do not take any vaccines while pregnant unless you have checked with your OB provider.  Fetal Movement: After 28 weeks we recommend you do "kick counts" twice daily.  Lie or sit down in a calm quiet environment and  count your baby movements "kicks".  You should feel your baby at least 10 times per hour.  If you have not felt 10 kicks within the first hour get up, walk around and have something sweet to eat or drink then repeat for an additional hour.  If count remains less than 10 per hour notify your provider.  Fumigating: Follow your pest control agent's advice as to how long to stay out of your home.  Ventilate the area well before re-entering.  Hemorrhoids:   Most over-the-counter preparations can be used during pregnancy.  Check your medication to see what is safe to use.  It is important to use a stool softener or fiber in your diet and to drink lots of liquids.  If hemorrhoids seem to be getting worse please call the office.   Hot Tubs:  Hot tubs Jacuzzis and saunas are not recommended while pregnant.  These increase your internal body temperature and should be avoided.  Intercourse:  Sexual intercourse is safe during pregnancy as long as you are comfortable, unless otherwise advised by your provider.  Spotting may occur after intercourse; report any bright red bleeding that is heavier than spotting.  Labor:  If you know that you are in labor, please go to the hospital.  If you are unsure, please call the office and let us help you decide what to do.  Lifting, straining, etc:  If your job requires heavy   lifting or straining please check with your provider for any limitations.  Generally, you should not lift items heavier than that you can lift simply with your hands and arms (no back muscles)  Painting:  Paint fumes do not harm your pregnancy, but may make you ill and should be avoided if possible.  Latex or water based paints have less odor than oils.  Use adequate ventilation while painting.  Permanents & Hair Color:  Chemicals in hair dyes are not recommended as they cause increase hair dryness which can increase hair loss during pregnancy.  " Highlighting" and permanents are allowed.  Dye may be  absorbed differently and permanents may not hold as well during pregnancy.  Sunbathing:  Use a sunscreen, as skin burns easily during pregnancy.  Drink plenty of fluids; avoid over heating.  Tanning Beds:  Because their possible side effects are still unknown, tanning beds are not recommended.  Ultrasound Scans:  Routine ultrasounds are performed at approximately 20 weeks.  You will be able to see your baby's general anatomy an if you would like to know the gender this can usually be determined as well.  If it is questionable when you conceived you may also receive an ultrasound early in your pregnancy for dating purposes.  Otherwise ultrasound exams are not routinely performed unless there is a medical necessity.  Although you can request a scan we ask that you pay for it when conducted because insurance does not cover " patient request" scans.  Work: If your pregnancy proceeds without complications you may work until your due date, unless your physician or employer advises otherwise.  Round Ligament Pain/Pelvic Discomfort:  Sharp, shooting pains not associated with bleeding are fairly common, usually occurring in the second trimester of pregnancy.  They tend to be worse when standing up or when you remain standing for long periods of time.  These are the result of pressure of certain pelvic ligaments called "round ligaments".  Rest, Tylenol and heat seem to be the most effective relief.  As the womb and fetus grow, they rise out of the pelvis and the discomfort improves.  Please notify the office if your pain seems different than that described.  It may represent a more serious condition.  Common Medications Safe in Pregnancy  Acne:      Constipation:  Benzoyl Peroxide     Colace  Clindamycin      Dulcolax Suppository  Topica Erythromycin     Fibercon  Salicylic Acid      Metamucil         Miralax AVOID:        Senakot   Accutane    Cough:  Retin-A       Cough  Drops  Tetracycline      Phenergan w/ Codeine if Rx  Minocycline      Robitussin (Plain & DM)  Antibiotics:     Crabs/Lice:  Ceclor       RID  Cephalosporins    AVOID:  E-Mycins      Kwell  Keflex  Macrobid/Macrodantin   Diarrhea:  Penicillin      Kao-Pectate  Zithromax      Imodium AD         PUSH FLUIDS AVOID:       Cipro     Fever:  Tetracycline      Tylenol (Regular or Extra  Minocycline       Strength)  Levaquin      Extra Strength-Do not  Exceed 8 tabs/24 hrs Caffeine:        <200mg/day (equiv. To 1 cup of coffee or  approx. 3 12 oz sodas)         Gas: Cold/Hayfever:       Gas-X  Benadryl      Mylicon  Claritin       Phazyme  **Claritin-D        Chlor-Trimeton    Headaches:  Dimetapp      ASA-Free Excedrin  Drixoral-Non-Drowsy     Cold Compress  Mucinex (Guaifenasin)     Tylenol (Regular or Extra  Sudafed/Sudafed-12 Hour     Strength)  **Sudafed PE Pseudoephedrine   Tylenol Cold & Sinus     Vicks Vapor Rub  Zyrtec  **AVOID if Problems With Blood Pressure         Heartburn: Avoid lying down for at least 1 hour after meals  Aciphex      Maalox     Rash:  Milk of Magnesia     Benadryl    Mylanta       1% Hydrocortisone Cream  Pepcid  Pepcid Complete   Sleep Aids:  Prevacid      Ambien   Prilosec       Benadryl  Rolaids       Chamomile Tea  Tums (Limit 4/day)     Unisom         Tylenol PM         Warm milk-add vanilla or  Hemorrhoids:       Sugar for taste  Anusol/Anusol H.C.  (RX: Analapram 2.5%)  Sugar Substitutes:  Hydrocortisone OTC     Ok in moderation  Preparation H      Tucks        Vaseline lotion applied to tissue with wiping    Herpes:     Throat:  Acyclovir      Oragel  Famvir  Valtrex     Vaccines:         Flu Shot Leg Cramps:       *Gardasil  Benadryl      Hepatitis A         Hepatitis B Nasal Spray:       Pneumovax  Saline Nasal Spray     Polio Booster         Tetanus Nausea:       Tuberculosis test or PPD  Vitamin  B6 25 mg TID   AVOID:    Dramamine      *Gardasil  Emetrol       Live Poliovirus  Ginger Root 250 mg QID    MMR (measles, mumps &  High Complex Carbs @ Bedtime    rebella)  Sea Bands-Accupressure    Varicella (Chickenpox)  Unisom 1/2 tab TID     *No known complications           If received before Pain:         Known pregnancy;   Darvocet       Resume series after  Lortab        Delivery  Percocet    Yeast:   Tramadol      Femstat  Tylenol 3      Gyne-lotrimin  Ultram       Monistat  Vicodin           MISC:         All Sunscreens             Hair Coloring/highlights          Insect Repellant's          (Including DEET)         Mystic Tans  

## 2024-11-07 NOTE — Progress Notes (Unsigned)
° °  Patient ID: Wanda Richmond, female   DOB: [birth year], 33 y.o.   MRN: [id number]  Reason for Consult: No chief complaint on file.   Referred by No ref. provider found  Subjective:     HPI:  Wanda Richmond is a 33 y.o. female ***  Past Medical History:  Diagnosis Date   Anemia    History of herpes genitalis 09/18/2020   Family History  Problem Relation Age of Onset   Healthy Mother    Healthy Father    Cervical cancer Maternal Grandmother    Breast cancer Paternal Grandmother    Colon cancer Neg Hx    Ovarian cancer Neg Hx    Past Surgical History:  Procedure Laterality Date   WISDOM TOOTH EXTRACTION      Short Social History:  Social History   Tobacco Use   Smoking status: Never    Passive exposure: Current   Smokeless tobacco: Never  Substance Use Topics   Alcohol use: Not Currently    Allergies[1]  Current Outpatient Medications  Medication Sig Dispense Refill   metroNIDAZOLE  (FLAGYL ) 500 MG tablet Take 1 tablet (500 mg total) by mouth 2 (two) times daily. 14 tablet 0   Norethindrone Acetate-Ethinyl Estrad-FE (MICROGESTIN  24 FE) 1-20 MG-MCG(24) tablet Take 1 tablet by mouth daily. 84 tablet 1   No current facility-administered medications for this visit.    REVIEW OF SYSTEMS      Objective:  Objective   There were no vitals filed for this visit. There is no height or weight on file to calculate BMI.  Physical Exam  Data: ***     Assessment/Plan:     ***     Slater Rains CNM     [1]  Allergies Allergen Reactions   Penicillins Shortness Of Breath and Cough

## 2024-11-08 ENCOUNTER — Ambulatory Visit (INDEPENDENT_AMBULATORY_CARE_PROVIDER_SITE_OTHER): Payer: Self-pay | Admitting: Advanced Practice Midwife

## 2024-11-08 ENCOUNTER — Encounter: Payer: Self-pay | Admitting: Advanced Practice Midwife

## 2024-11-08 VITALS — BP 106/75 | HR 96 | Ht 66.5 in | Wt 194.0 lb

## 2024-11-08 DIAGNOSIS — Z8759 Personal history of other complications of pregnancy, childbirth and the puerperium: Secondary | ICD-10-CM

## 2024-11-08 DIAGNOSIS — O039 Complete or unspecified spontaneous abortion without complication: Secondary | ICD-10-CM

## 2024-11-08 DIAGNOSIS — Z09 Encounter for follow-up examination after completed treatment for conditions other than malignant neoplasm: Secondary | ICD-10-CM

## 2024-11-22 ENCOUNTER — Encounter: Payer: Self-pay | Admitting: Advanced Practice Midwife

## 2024-11-22 ENCOUNTER — Ambulatory Visit: Admitting: Advanced Practice Midwife

## 2024-11-22 ENCOUNTER — Other Ambulatory Visit (HOSPITAL_COMMUNITY)
Admission: RE | Admit: 2024-11-22 | Discharge: 2024-11-22 | Disposition: A | Payer: Self-pay | Source: Ambulatory Visit | Attending: Advanced Practice Midwife | Admitting: Advanced Practice Midwife

## 2024-11-22 VITALS — BP 95/60 | HR 90 | Wt 189.2 lb

## 2024-11-22 DIAGNOSIS — N898 Other specified noninflammatory disorders of vagina: Secondary | ICD-10-CM

## 2024-11-22 DIAGNOSIS — Z124 Encounter for screening for malignant neoplasm of cervix: Secondary | ICD-10-CM

## 2024-11-22 NOTE — Patient Instructions (Signed)
 [email or web address] for more information  Pap Test: What to Know Why am I having this test? A Pap test, also called a Pap smear, is a screening test to check for signs of: Infection. Cancer of the cervix. The cervix is the lowest part of the uterus. Precancerous changes. These are changes that may be a sign that cancer is developing. Females need this test regularly. In general, you should have a Pap test every 3 years until you reach menopause or you are 32 years old. If you are 53-23 years old you may choose to have their Pap test done at the same time as an human papillomavirus (HPV) test every 5 years instead of every 3 years. Your health care provider may recommend having Pap tests more or less often depending on your medical conditions and past Pap test results. What is being tested? Cervical cells are tested for signs of infection or abnormalities. What kind of sample is taken?  Your provider will collect a sample of cells from the surface of your cervix. This will be done using a small cotton swab, plastic spatula, or brush that is inserted into your vagina using a tool called a speculum. This sample is often collected during a pelvic exam, when you are lying on your back on an exam table with your feet in footrests, called stirrups. In some cases, fluids (secretions) from the cervix or vagina may also be collected. How do I prepare for this test? Know where you are in your menstrual cycle. If you're menstruating on the day of the test, you may be asked to reschedule. You may need to reschedule if you have a known vaginal infection on the day of the test. Follow instructions from your provider about: Changing or stopping your regular medicines. Some medicines, such as vaginal medicines and tetracycline, can cause abnormal test results. Avoiding douching 2-3 days before or the day of the test. Tell a health care provider about: Any allergies you have. All medicines you take. These include  vitamins, herbs, eye drops, and creams. Any bleeding problems you have. Any surgeries you've had. Any medical problems you have. Whether you're pregnant or may be pregnant. How are the results reported? Your test results will be reported as either abnormal or normal. What do the results mean? A normal test result means that you do not have signs of cancer of the cervix. An abnormal result may mean that you have: Cancer. A Pap test by itself is not enough to diagnose cancer. You will have more tests done if cancer is suspected. Precancerous changes in your cervix. Inflammation of the cervix. A sexually transmitted infection (STI). A fungal infection. An infection from a parasite. Talk with your provider about what your results mean. More tests may be needed. Questions to ask your health care provider Ask your provider, or the department that is doing the test: When will my results be ready? How will I get my results? What are my treatment options? What other tests do I need? What are my next steps? This information is not intended to replace advice given to you by your health care provider. Make sure you discuss any questions you have with your health care provider. Document Revised: 01/27/2024 Document Reviewed: 01/27/2024 Elsevier Patient Education  2025 [employer].

## 2024-11-22 NOTE — Progress Notes (Signed)
 "  Patient ID: Wanda Richmond, female   DOB: 1991/01/25, 34 y.o.   MRN: [id number]  Reason for Visit: vaginal odor   Subjective:  HPI:  Wanda Richmond is a 34 y.o. female patient returns to office for follow up spontaneous abortion. Patient denies abdominal/pelvic pain, back pain or fever but reports vaginal odor and spotting. No itching or irritation. She was prescribed and started taking Metronidazole  2 days ago for presumed bacterial vaginosis. Starting to notice an improvement in symptoms. We discussed that she was due for a follow up PAP smear (ASCUS/HR HPV negative PAP in 2024) at her annual last year. She agrees to have PAP today. Vaginitis aptima done as well.   Past Medical History:  Diagnosis Date   Anemia    History of herpes genitalis 09/18/2020   Family History  Problem Relation Age of Onset   Healthy Mother    Healthy Father    Cervical cancer Maternal Grandmother    Breast cancer Paternal Grandmother    Colon cancer Neg Hx    Ovarian cancer Neg Hx    Past Surgical History:  Procedure Laterality Date   WISDOM TOOTH EXTRACTION      Short Social History:  Social History   Tobacco Use   Smoking status: Never    Passive exposure: Current   Smokeless tobacco: Never  Substance Use Topics   Alcohol use: Not Currently    Allergies[1]  Current Outpatient Medications  Medication Sig Dispense Refill   ferrous sulfate 325 (65 FE) MG tablet Take 325 mg by mouth daily with breakfast.     metroNIDAZOLE  (FLAGYL ) 500 MG tablet Take 500 mg by mouth 2 (two) times daily.     Norethindrone Acetate-Ethinyl Estrad-FE (MICROGESTIN  24 FE) 1-20 MG-MCG(24) tablet Take 1 tablet by mouth daily. (Patient not taking: Reported on 11/22/2024) 84 tablet 1   No current facility-administered medications for this visit.    Review of Systems  Constitutional:  Negative for chills and fever.  HENT:  Negative for congestion, ear discharge, ear pain, hearing loss, sinus pain and sore throat.    Eyes:  Negative for blurred vision and double vision.  Respiratory:  Negative for cough, shortness of breath and wheezing.   Cardiovascular:  Negative for chest pain, palpitations and leg swelling.  Gastrointestinal:  Negative for abdominal pain, blood in stool, constipation, diarrhea, heartburn, melena, nausea and vomiting.  Genitourinary:  Negative for dysuria, flank pain, frequency, hematuria and urgency.       Positive for vaginal odor (fishy/old blood)  Musculoskeletal:  Negative for back pain, joint pain and myalgias.  Skin:  Negative for itching and rash.  Neurological:  Negative for dizziness, tingling, tremors, sensory change, speech change, focal weakness, seizures, loss of consciousness, weakness and headaches.  Endo/Heme/Allergies:  Negative for environmental allergies. Does not bruise/bleed easily.  Psychiatric/Behavioral:  Negative for depression, hallucinations, memory loss, substance abuse and suicidal ideas. The patient is not nervous/anxious and does not have insomnia.         Objective:  Objective   Vitals:   11/22/24 1109  BP: 95/60  Pulse: 90  Weight: 189 lb 3.2 oz (85.8 kg)   Body mass index is 30.08 kg/m. Constitutional: Well nourished, well developed female in no acute distress.  HEENT: normal Skin: Warm and dry.  Cardiovascular: Regular rate and rhythm.   Respiratory: Clear to auscultation bilateral. Normal respiratory effort Psych: Alert and Oriented x3. No memory deficits. Normal mood and affect.    Pelvic exam:  is not  limited by body habitus EGBUS: within normal limits Vagina: within normal limits and with normal mucosa, brown blood in the vault Cervix: ectropion seen, friable   Assessment/Plan:     34 y.o. G6 P75 female with possible BV, cervical cancer screening follow up  Continue taking previously prescribed Metronidazole  Aptima PAP smear Follow up after labs result   Slater Rains, CNM [facility] 11/22/2024 11:57 AM       [1]  Allergies Allergen Reactions   Penicillins Shortness Of Breath and Cough   "

## 2024-11-25 LAB — CERVICOVAGINAL ANCILLARY ONLY
Bacterial Vaginitis (gardnerella): POSITIVE — AB
Candida Glabrata: NEGATIVE
Candida Vaginitis: NEGATIVE
Comment: NEGATIVE
Comment: NEGATIVE
Comment: NEGATIVE

## 2024-11-27 LAB — CYTOLOGY - PAP: Diagnosis: NEGATIVE

## 2024-11-28 ENCOUNTER — Ambulatory Visit: Payer: Self-pay | Admitting: Advanced Practice Midwife
